# Patient Record
Sex: Female | Born: 1965 | Race: Black or African American | Hispanic: No | Marital: Married | Smoking: Never smoker
Health system: Southern US, Community
[De-identification: ages and names within clinical notes are randomized; demographics above are authoritative.]

## PROBLEM LIST (undated history)

## (undated) DIAGNOSIS — R51 Headache: Secondary | ICD-10-CM

## (undated) DIAGNOSIS — K279 Peptic ulcer, site unspecified, unspecified as acute or chronic, without hemorrhage or perforation: Secondary | ICD-10-CM

## (undated) DIAGNOSIS — Z9889 Other specified postprocedural states: Secondary | ICD-10-CM

## (undated) DIAGNOSIS — Z973 Presence of spectacles and contact lenses: Secondary | ICD-10-CM

## (undated) DIAGNOSIS — T7840XA Allergy, unspecified, initial encounter: Secondary | ICD-10-CM

## (undated) DIAGNOSIS — R112 Nausea with vomiting, unspecified: Secondary | ICD-10-CM

## (undated) DIAGNOSIS — E785 Hyperlipidemia, unspecified: Secondary | ICD-10-CM

## (undated) DIAGNOSIS — K635 Polyp of colon: Secondary | ICD-10-CM

## (undated) DIAGNOSIS — R519 Headache, unspecified: Secondary | ICD-10-CM

## (undated) DIAGNOSIS — J45909 Unspecified asthma, uncomplicated: Secondary | ICD-10-CM

## (undated) DIAGNOSIS — D259 Leiomyoma of uterus, unspecified: Secondary | ICD-10-CM

## (undated) DIAGNOSIS — J301 Allergic rhinitis due to pollen: Secondary | ICD-10-CM

## (undated) DIAGNOSIS — G43909 Migraine, unspecified, not intractable, without status migrainosus: Secondary | ICD-10-CM

## (undated) DIAGNOSIS — R7303 Prediabetes: Secondary | ICD-10-CM

## (undated) HISTORY — DX: Polyp of colon: K63.5

## (undated) HISTORY — PX: TUBAL LIGATION: SHX77

## (undated) HISTORY — DX: Migraine, unspecified, not intractable, without status migrainosus: G43.909

## (undated) HISTORY — DX: Hyperlipidemia, unspecified: E78.5

## (undated) HISTORY — DX: Unspecified asthma, uncomplicated: J45.909

## (undated) HISTORY — DX: Peptic ulcer, site unspecified, unspecified as acute or chronic, without hemorrhage or perforation: K27.9

## (undated) HISTORY — DX: Allergy, unspecified, initial encounter: T78.40XA

## (undated) HISTORY — PX: ABDOMINAL HYSTERECTOMY: SHX81

## (undated) HISTORY — DX: Headache, unspecified: R51.9

## (undated) HISTORY — DX: Headache: R51

## (undated) HISTORY — DX: Allergic rhinitis due to pollen: J30.1

---

## 1991-12-24 HISTORY — PX: TUBAL LIGATION: SHX77

## 2006-12-23 HISTORY — PX: NOVASURE ABLATION: SHX5394

## 2008-12-23 HISTORY — PX: OTHER SURGICAL HISTORY: SHX169

## 2008-12-23 HISTORY — PX: THYROIDECTOMY, PARTIAL: SHX18

## 2013-03-10 DIAGNOSIS — Z780 Asymptomatic menopausal state: Secondary | ICD-10-CM | POA: Insufficient documentation

## 2019-02-03 ENCOUNTER — Ambulatory Visit: Payer: 59 | Admitting: Family Medicine

## 2019-02-03 ENCOUNTER — Encounter: Payer: Self-pay | Admitting: Family Medicine

## 2019-02-03 VITALS — BP 110/80 | HR 71 | Temp 98.2°F | Ht 62.0 in | Wt 170.0 lb

## 2019-02-03 DIAGNOSIS — Z8669 Personal history of other diseases of the nervous system and sense organs: Secondary | ICD-10-CM | POA: Diagnosis not present

## 2019-02-03 DIAGNOSIS — Z8709 Personal history of other diseases of the respiratory system: Secondary | ICD-10-CM

## 2019-02-03 DIAGNOSIS — Z7689 Persons encountering health services in other specified circumstances: Secondary | ICD-10-CM

## 2019-02-03 DIAGNOSIS — J302 Other seasonal allergic rhinitis: Secondary | ICD-10-CM

## 2019-02-03 DIAGNOSIS — E785 Hyperlipidemia, unspecified: Secondary | ICD-10-CM | POA: Diagnosis not present

## 2019-02-03 NOTE — Patient Instructions (Signed)

## 2019-02-03 NOTE — Progress Notes (Signed)
Patient presents to clinic today to establish care.  SUBJECTIVE: PMH:Pt is a 53 yo female with pmh sig for HLD, allergies, migraines, asthma, h/o ulcers.  HLD: -noted yrs ago -not on meds -changed diet.  Pescatarian. -normal ever since  H/o asthma: -exercise induced -asymptomatic x yrs -not requiring inhaler  H/o migraines: -may have 1-2 per year -will happen if does not get enough sleep or does not eat -not requiring meds at this time.  Seasonal allergies: -no symptoms since moving to area -not taking any meds  Allergies: NKDA  Past surgical history: Tubal ligation 1993 Partial thyroidectomy 2010 for a fatty cyst Lipoma removal left shoulder 2000 [facility] 2008  Social history: Patient is married.  Moved from [place] to be with her husband.  She also moved her parents to the area.  Pt has a master's degree and currently works as a Tourist information centre manager.  Pt to start a new job at [employer] in March.  Pt has 2 children.  Pt endorses social alcohol use.  Pt denies tobacco and drug use.  Family medical history: Mom-alive, asthma, lymphoma hearing loss, HLD, HTN, kidney disease Dad-alive, asthma, dementia, neuropathy, tremors, prostate cancer, hearing loss, HLD, HTN, stroke Brother-Laura Rice, deceased, alcohol abuse, diabetes, early death Daughter-alive, alcohol abuse, drug abuse Son-alive asthma MGM-deceased, arthritis, diabetes MGF-deceased, arthritis PGM-deceased, asthma, early death PGF-deceased, alcohol abuse  Health Maintenance: Dental -- Vision -- Immunizations -- Colonoscopy -- Mammogram -- PAP --  Bone Density --    Past Medical History:  Diagnosis Date  . Colon polyps   . Frequent headaches   . Hay fever   . Hyperlipidemia   . Migraines   . Peptic ulcer     Past Surgical History:  Procedure Laterality Date  . [place]  2008  . partial thyrodectomy  2010  . TUBAL LIGATION      No current outpatient medications on file prior to visit.    No current facility-administered medications on file prior to visit.     No Known Allergies  History reviewed. No pertinent family history.  Social History   Socioeconomic History  . Marital status: Married    Spouse name: Not on file  . Number of children: Not on file  . Years of education: Not on file  . Highest education level: Not on file  Occupational History  . Not on file  Social Needs  . Financial resource strain: Not on file  . Food insecurity:    Worry: Not on file    Inability: Not on file  . Transportation needs:    Medical: Not on file    Non-medical: Not on file  Tobacco Use  . Smoking status: Never Smoker  . Smokeless tobacco: Never Used  Substance and Sexual Activity  . Alcohol use: Yes  . Drug use: Never  . Sexual activity: Yes  Lifestyle  . Physical activity:    Days per week: Not on file    Minutes per session: Not on file  . Stress: Not on file  Relationships  . Social connections:    Talks on phone: Not on file    Gets together: Not on file    Attends religious service: Not on file    Active member of club or organization: Not on file    Attends meetings of clubs or organizations: Not on file    Relationship status: Not on file  . Intimate partner violence:    Fear of current or ex partner: Not  on file    Emotionally abused: Not on file    Physically abused: Not on file    Forced sexual activity: Not on file  Other Topics Concern  . Not on file  Social History Narrative  . Not on file    ROS General: Denies fever, chills, night sweats, changes in weight, changes in appetite HEENT: Denies headaches, ear pain, changes in vision, rhinorrhea, sore throat CV: Denies CP, palpitations, SOB, orthopnea Pulm: Denies SOB, cough, wheezing GI: Denies abdominal pain, nausea, vomiting, diarrhea, constipation GU: Denies dysuria, hematuria, frequency, vaginal discharge Msk: Denies muscle cramps, joint pains Neuro: Denies weakness, numbness,  tingling Skin: Denies rashes, bruising Psych: Denies depression, anxiety, hallucinations  BP 110/80 (BP Location: Right Leg, Patient Position: Sitting, Cuff Size: Normal)   Pulse 71   Temp 98.2 F (36.8 C) (Oral)   Ht 5\' 2"  (1.575 m)   Wt 170 lb (77.1 kg)   SpO2 98%   BMI 31.09 kg/m   Physical Exam Gen. Pleasant, well developed, well-nourished, in NAD HEENT - [place]/AT, PERRL, no scleral icterus, no nasal drainage, pharynx without erythema or exudate. Lungs: no use of accessory muscles, CTAB, no wheezes, rales or rhonchi Cardiovascular: RRR, No r/g/m, no peripheral edema Abdomen: BS present, soft, nontender,nondistended  Neuro:  A&Ox3, CN II-XII intact, normal gait Skin:  Warm, dry, intact, no lesions  No results found for this or any previous visit (from the past 2160 hour(s)).  Assessment/Plan: Hyperlipidemia, unspecified hyperlipidemia type -discussed ways to reduce cholesterol including fish oil -continue lifestyle modificaitons -will check lipids at CPE when pt fasting  Seasonal allergies -stable -discussed avoiding triggers when possible -ok to use local honey, saline nasal rinse if needed  History of migraine -stable -discussed reducing migraine triggers. Pt encouraged to stay hydrated, get plenty of rest, eat regularly, and decrease stress  History of asthma -asymptomatic  Encounter to establish care -We reviewed the PMH, PSH, FH, SH, Meds and Allergies. -We provided refills for any medications we will prescribe as needed. -We addressed current concerns per orders and patient instructions. -We have asked for records for pertinent exams, studies, vaccines and notes from previous providers. -We have advised patient to follow up per instructions below.  Follow-up PRN in the next few weeks for CPE  Grier Mitts, MD

## 2019-03-01 ENCOUNTER — Encounter: Payer: Self-pay | Admitting: Family Medicine

## 2019-03-01 ENCOUNTER — Ambulatory Visit (INDEPENDENT_AMBULATORY_CARE_PROVIDER_SITE_OTHER): Payer: 59 | Admitting: Family Medicine

## 2019-03-01 VITALS — BP 120/70 | HR 80 | Temp 98.0°F | Wt 176.0 lb

## 2019-03-01 DIAGNOSIS — E785 Hyperlipidemia, unspecified: Secondary | ICD-10-CM | POA: Diagnosis not present

## 2019-03-01 DIAGNOSIS — G629 Polyneuropathy, unspecified: Secondary | ICD-10-CM

## 2019-03-01 DIAGNOSIS — M25541 Pain in joints of right hand: Secondary | ICD-10-CM

## 2019-03-01 DIAGNOSIS — Z0000 Encounter for general adult medical examination without abnormal findings: Secondary | ICD-10-CM

## 2019-03-01 DIAGNOSIS — M25542 Pain in joints of left hand: Secondary | ICD-10-CM

## 2019-03-01 LAB — CBC WITH DIFFERENTIAL/PLATELET
Basophils Absolute: 0.1 10*3/uL (ref 0.0–0.1)
Basophils Relative: 1.9 % (ref 0.0–3.0)
Eosinophils Absolute: 0.2 10*3/uL (ref 0.0–0.7)
Eosinophils Relative: 4.7 % (ref 0.0–5.0)
HEMATOCRIT: 42 % (ref 36.0–46.0)
Hemoglobin: 13.8 g/dL (ref 12.0–15.0)
Lymphocytes Relative: 46.8 % — ABNORMAL HIGH (ref 12.0–46.0)
Lymphs Abs: 1.6 10*3/uL (ref 0.7–4.0)
MCHC: 32.8 g/dL (ref 30.0–36.0)
MCV: 85.3 fl (ref 78.0–100.0)
Monocytes Absolute: 0.3 10*3/uL (ref 0.1–1.0)
Monocytes Relative: 7.6 % (ref 3.0–12.0)
NEUTROS ABS: 1.4 10*3/uL (ref 1.4–7.7)
Neutrophils Relative %: 39 % — ABNORMAL LOW (ref 43.0–77.0)
PLATELETS: 257 10*3/uL (ref 150.0–400.0)
RBC: 4.92 Mil/uL (ref 3.87–5.11)
RDW: 14 % (ref 11.5–15.5)
WBC: 3.5 10*3/uL — ABNORMAL LOW (ref 4.0–10.5)

## 2019-03-01 LAB — BASIC METABOLIC PANEL
BUN: 11 mg/dL (ref 6–23)
CO2: 30 mEq/L (ref 19–32)
Calcium: 9.6 mg/dL (ref 8.4–10.5)
Chloride: 102 mEq/L (ref 96–112)
Creatinine, Ser: 0.95 mg/dL (ref 0.40–1.20)
GFR: 74.51 mL/min (ref >60.00)
Glucose, Bld: 95 mg/dL (ref 70–99)
Potassium: 4.5 mEq/L (ref 3.5–5.1)
Sodium: 140 mEq/L (ref 135–145)

## 2019-03-01 LAB — VITAMIN B12: Vitamin B-12: 691 pg/mL (ref 211–911)

## 2019-03-01 LAB — LIPID PANEL
Cholesterol: 245 mg/dL — ABNORMAL HIGH (ref 0–200)
HDL: 51.6 mg/dL (ref >39.00)
NonHDL: 192.91
Total CHOL/HDL Ratio: 5
Triglycerides: 230 mg/dL — ABNORMAL HIGH (ref 0.0–149.0)
VLDL: 46 mg/dL — ABNORMAL HIGH (ref 0.0–40.0)

## 2019-03-01 LAB — C-REACTIVE PROTEIN: CRP: <1 mg/dL (ref 0.5–20.0)

## 2019-03-01 LAB — HEMOGLOBIN A1C: Hgb A1c MFr Bld: 5.7 % (ref 4.6–6.5)

## 2019-03-01 LAB — LDL CHOLESTEROL, DIRECT: Direct LDL: 151 mg/dL

## 2019-03-01 LAB — SEDIMENTATION RATE: Sed Rate: 12 mm/hr (ref 0–30)

## 2019-03-01 LAB — FOLATE

## 2019-03-01 LAB — TSH: TSH: 2.91 u[IU]/mL (ref 0.35–4.50)

## 2019-03-01 LAB — T4, FREE: Free T4: 0.72 ng/dL (ref 0.60–1.60)

## 2019-03-01 NOTE — Progress Notes (Signed)
Subjective:     Laura Rice is a 53 y.o. female and is here for a comprehensive physical exam. The patient reports problems - Joint pain, nerve pain.  Patient states overall she is doing well.  Patient endorses eating salty peanuts on Wednesday been developing pain from her back down her legs into her feet.  Patient has noticed this before when eating something salty.  Patient states pain did not resolve until Sunday.  Pain was noted as a throbbing, achy, "nerve pain" and not a muscle pain.  Patient denied muscle cramps/spasms.  Patient states the pain kept her awake at night.  Patient also endorses experiencing joint pain.  Patient notes pain in her fingers off and on as well as other joints.  Patient denies injury, edema.  Colonoscopy-2017 Pap-03/22/2018 Influenza vaccine 2019  Social History   Socioeconomic History  . Marital status: Married    Spouse name: Not on file  . Number of children: Not on file  . Years of education: Not on file  . Highest education level: Not on file  Occupational History  . Not on file  Social Needs  . Financial resource strain: Not on file  . Food insecurity:    Worry: Not on file    Inability: Not on file  . Transportation needs:    Medical: Not on file    Non-medical: Not on file  Tobacco Use  . Smoking status: Never Smoker  . Smokeless tobacco: Never Used  Substance and Sexual Activity  . Alcohol use: Yes  . Drug use: Never  . Sexual activity: Yes  Lifestyle  . Physical activity:    Days per week: Not on file    Minutes per session: Not on file  . Stress: Not on file  Relationships  . Social connections:    Talks on phone: Not on file    Gets together: Not on file    Attends religious service: Not on file    Active member of club or organization: Not on file    Attends meetings of clubs or organizations: Not on file    Relationship status: Not on file  . Intimate partner violence:    Fear of current or ex partner: Not on file     Emotionally abused: Not on file    Physically abused: Not on file    Forced sexual activity: Not on file  Other Topics Concern  . Not on file  Social History Narrative  . Not on file   Health Maintenance  Topic Date Due  . HIV Screening  05/07/1981  . TETANUS/TDAP  05/07/1985  . PAP SMEAR-Modifier  05/08/1987  . MAMMOGRAM  05/07/2016  . COLONOSCOPY  05/07/2016  . INFLUENZA VACCINE  07/23/2018    The following portions of the patient's history were reviewed and updated as appropriate: allergies, current medications, past family history, past medical history, past social history, past surgical history and problem list.  Review of Systems Pertinent items noted in HPI and remainder of comprehensive ROS otherwise negative.   Objective:    BP 120/70 (BP Location: Left Arm, Patient Position: Sitting, Cuff Size: Normal)   Pulse 80   Temp 98 F (36.7 C) (Oral)   Wt 176 lb (79.8 kg)   SpO2 98%   BMI 32.19 kg/m  General appearance: alert, cooperative and no distress Head: Normocephalic, without obvious abnormality, atraumatic Eyes: conjunctivae/corneas clear. PERRL, EOM's intact. Fundi benign. Ears: normal TM's and external ear canals both ears Nose: Nares normal.  Septum midline. Mucosa normal. No drainage or sinus tenderness. Throat: lips, mucosa, and tongue normal; teeth and gums normal Neck: no adenopathy, no carotid bruit, no JVD, supple, trachea midline and thyroid not enlarged, symmetric, no tenderness/mass/nodules Lungs: clear to auscultation bilaterally Heart: regular rate and rhythm, S1, S2 normal, no murmur, click, rub or gallop Abdomen: soft, non-tender; bowel sounds normal; no masses,  no organomegaly Extremities: extremities normal, atraumatic, no cyanosis or edema Pulses: 2+ and symmetric Skin: Skin color, texture, turgor normal. No rashes or lesions well-healed surgical scar at base of anterior neck Lymph nodes: Cervical, supraclavicular, and axillary nodes  normal. Neurologic: Alert and oriented X 3, normal strength and tone. Normal symmetric reflexes. Normal coordination and gait    Assessment:    Healthy female exam.      Plan:     Anticipatory guidance given including wearing seatbelts, smoke detectors in the home, increasing physical activity, increasing p.o. intake of water and vegetables. -We will obtain labs -Colonoscopy up-to-date, 2017 -Pap up-to-date 03/22/2018 -Patient to schedule mammogram -Given handout -Next CPE in 1 year See After Visit Summary for Counseling Recommendations   HLD -Diet controlled -We will obtain lipid panel  Neuropathy -We will obtain labs including B12, folate -Patient advised to continue limiting sodium intake  Arthralgias -Discussed limiting sodium intake -Given handout -We will obtain labs including Rh factor, ESR, CRP  Follow-up PRN in the next few months  Grier Mitts, MD

## 2019-03-01 NOTE — Patient Instructions (Signed)
Preventive Care 40-64 Years, Female Preventive care refers to lifestyle choices and visits with your health care provider that can promote health and wellness. What does preventive care include?   A yearly physical exam. This is also called an annual well check.  Dental exams once or twice a year.  Routine eye exams. Ask your health care provider how often you should have your eyes checked.  Personal lifestyle choices, including: ? Daily care of your teeth and gums. ? Regular physical activity. ? Eating a healthy diet. ? Avoiding tobacco and drug use. ? Limiting alcohol use. ? Practicing safe sex. ? Taking low-dose aspirin daily starting at age 50. ? Taking vitamin and mineral supplements as recommended by your health care provider. What happens during an annual well check? The services and screenings done by your health care provider during your annual well check will depend on your age, overall health, lifestyle risk factors, and family history of disease. Counseling Your health care provider may ask you questions about your:  Alcohol use.  Tobacco use.  Drug use.  Emotional well-being.  Home and relationship well-being.  Sexual activity.  Eating habits.  Work and work environment.  Method of birth control.  Menstrual cycle.  Pregnancy history. Screening You may have the following tests or measurements:  Height, weight, and BMI.  Blood pressure.  Lipid and cholesterol levels. These may be checked every 5 years, or more frequently if you are over 50 years old.  Skin check.  Lung cancer screening. You may have this screening every year starting at age 55 if you have a 30-pack-year history of smoking and currently smoke or have quit within the past 15 years.  Colorectal cancer screening. All adults should have this screening starting at age 50 and continuing until age 75. Your health care provider may recommend screening at age 45. You will have tests every  1-10 years, depending on your results and the type of screening test. People at increased risk should start screening at an earlier age. Screening tests may include: ? Guaiac-based fecal occult blood testing. ? Fecal immunochemical test (FIT). ? Stool DNA test. ? Virtual colonoscopy. ? Sigmoidoscopy. During this test, a flexible tube with a tiny camera (sigmoidoscope) is used to examine your rectum and lower colon. The sigmoidoscope is inserted through your anus into your rectum and lower colon. ? Colonoscopy. During this test, a long, thin, flexible tube with a tiny camera (colonoscope) is used to examine your entire colon and rectum.  Hepatitis C blood test.  Hepatitis B blood test.  Sexually transmitted disease (STD) testing.  Diabetes screening. This is done by checking your blood sugar (glucose) after you have not eaten for a while (fasting). You may have this done every 1-3 years.  Mammogram. This may be done every 1-2 years. Talk to your health care provider about when you should start having regular mammograms. This may depend on whether you have a family history of breast cancer.  BRCA-related cancer screening. This may be done if you have a family history of breast, ovarian, tubal, or peritoneal cancers.  Pelvic exam and Pap test. This may be done every 3 years starting at age 21. Starting at age 30, this may be done every 5 years if you have a Pap test in combination with an HPV test.  Bone density scan. This is done to screen for osteoporosis. You may have this scan if you are at high risk for osteoporosis. Discuss your test results, treatment options,   and if necessary, the need for more tests with your health care provider. Vaccines Your health care provider may recommend certain vaccines, such as:  Influenza vaccine. This is recommended every year.  Tetanus, diphtheria, and acellular pertussis (Tdap, Td) vaccine. You may need a Td booster every 10 years.  Varicella  vaccine. You may need this if you have not been vaccinated.  Zoster vaccine. You may need this after age 77.  Measles, mumps, and rubella (MMR) vaccine. You may need at least one dose of MMR if you were born in 1957 or later. You may also need a second dose.  Pneumococcal 13-valent conjugate (PCV13) vaccine. You may need this if you have certain conditions and were not previously vaccinated.  Pneumococcal polysaccharide (PPSV23) vaccine. You may need one or two doses if you smoke cigarettes or if you have certain conditions.  Meningococcal vaccine. You may need this if you have certain conditions.  Hepatitis A vaccine. You may need this if you have certain conditions or if you travel or work in places where you may be exposed to hepatitis A.  Hepatitis B vaccine. You may need this if you have certain conditions or if you travel or work in places where you may be exposed to hepatitis B.  Haemophilus influenzae type b (Hib) vaccine. You may need this if you have certain conditions. Talk to your health care provider about which screenings and vaccines you need and how often you need them. This information is not intended to replace advice given to you by your health care provider. Make sure you discuss any questions you have with your health care provider. Document Released: 01/05/2016 Document Revised: 01/29/2018 Document Reviewed: 10/10/2015 Elsevier Interactive Patient Education  2019 [place].  High Cholesterol  High cholesterol is a condition in which the blood has high levels of a white, waxy, fat-like substance (cholesterol). The human body needs small amounts of cholesterol. The liver makes all the cholesterol that the body needs. Extra (excess) cholesterol comes from the food that we eat. Cholesterol is carried from the liver by the blood through the blood vessels. If you have high cholesterol, deposits (plaques) may build up on the walls of your blood vessels (arteries). Plaques  make the arteries narrower and stiffer. Cholesterol plaques increase your risk for heart attack and stroke. Work with your health care provider to keep your cholesterol levels in a healthy range. What increases the risk? This condition is more likely to develop in people who:  Eat foods that are high in animal fat (saturated fat) or cholesterol.  Are overweight.  Are not getting enough exercise.  Have a family history of high cholesterol. What are the signs or symptoms? There are no symptoms of this condition. How is this diagnosed? This condition may be diagnosed from the results of a blood test.  If you are older than age 31, your health care provider may check your cholesterol every 4-6 years.  You may be checked more often if you already have high cholesterol or other risk factors for heart disease. The blood test for cholesterol measures:  "Bad" cholesterol (LDL cholesterol). This is the main type of cholesterol that causes heart disease. The desired level for LDL is less than 100.  "Good" cholesterol (HDL cholesterol). This type helps to protect against heart disease by cleaning the arteries and carrying the LDL away. The desired level for HDL is 60 or higher.  Triglycerides. These are fats that the body can store or burn for  energy. The desired number for triglycerides is lower than 150.  Total cholesterol. This is a measure of the total amount of cholesterol in your blood, including LDL cholesterol, HDL cholesterol, and triglycerides. A healthy number is less than 200. How is this treated? This condition is treated with diet changes, lifestyle changes, and medicines. Diet changes  This may include eating more whole grains, fruits, vegetables, nuts, and fish.  This may also include cutting back on red meat and foods that have a lot of added sugar. Lifestyle changes  Changes may include getting at least 40 minutes of aerobic exercise 3 times a week. Aerobic exercises  include walking, biking, and swimming. Aerobic exercise along with a healthy diet can help you maintain a healthy weight.  Changes may also include quitting smoking. Medicines  Medicines are usually given if diet and lifestyle changes have failed to reduce your cholesterol to healthy levels.  Your health care provider may prescribe a statin medicine. Statin medicines have been shown to reduce cholesterol, which can reduce the risk of heart disease. Follow these instructions at home: Eating and drinking If told by your health care provider:  Eat chicken (without skin), fish, veal, shellfish, ground [place] breast, and round or loin cuts of red meat.  Do not eat fried foods or fatty meats, such as hot dogs and salami.  Eat plenty of fruits, such as apples.  Eat plenty of vegetables, such as broccoli, potatoes, and carrots.  Eat beans, peas, and lentils.  Eat grains such as barley, rice, couscous, and bulgur wheat.  Eat pasta without cream sauces.  Use skim or nonfat milk, and eat low-fat or nonfat yogurt and cheeses.  Do not eat or drink whole milk, cream, ice cream, egg yolks, or hard cheeses.  Do not eat stick margarine or tub margarines that contain trans fats (also called partially hydrogenated oils).  Do not eat saturated tropical oils, such as coconut oil and palm oil.  Do not eat cakes, cookies, crackers, or other baked goods that contain trans fats.  General instructions  Exercise as directed by your health care provider. Increase your activity level with activities such as gardening, walking, and taking the stairs.  Take over-the-counter and prescription medicines only as told by your health care provider.  Do not use any products that contain nicotine or tobacco, such as cigarettes and e-cigarettes. If you need help quitting, ask your health care provider.  Keep all follow-up visits as told by your health care provider. This is important. Contact a health care  provider if:  You are struggling to maintain a healthy diet or weight.  You need help to start on an exercise program.  You need help to stop smoking. Get help right away if:  You have chest pain.  You have trouble breathing. This information is not intended to replace advice given to you by your health care provider. Make sure you discuss any questions you have with your health care provider. Document Released: 12/09/2005 Document Revised: 07/06/2016 Document Reviewed: 06/08/2016 Elsevier Interactive Patient Education  2019 [place].  Joint Pain Joint pain (arthralgia) may be caused by many things. Joint pain is likely to go away when you follow instructions from your health care provider for relieving pain at home. However, joint pain can also be caused by conditions that require more treatment. Common causes of joint pain include:  Bruising in the area of the joint.  Injury caused by repeating certain movements too many times (overuse injury).  Age-related joint wear and tear (osteoarthritis).  Buildup of uric acid crystals in the joint (gout).  Inflammation of the joint (rheumatic disease).  Various other forms of arthritis.  Infections of the joint (septic arthritis) or of the bone (osteomyelitis). Your health care provider may recommend that you take pain medicine or wear a supportive device like an elastic bandage, sling, or splint. If your joint pain continues, you may need lab or imaging tests to diagnose the cause of your joint pain. Follow these instructions at home: Managing pain, stiffness, and swelling   If directed, put ice on the painful area. Icing can help to relieve joint pain and swelling. ? Put ice in a plastic bag. ? Place a towel between your skin and the bag. ? Leave the ice on for 20 minutes, 2-3 times a day.  If directed, apply heat to the painful area as often as told by your health care provider. Heat can reduce the stiffness of your muscles  and joints. Use the heat source that your health care provider recommends, such as a moist heat pack or a heating pad. ? Place a towel between your skin and the heat source. ? Leave the heat on for 20-30 minutes. ? Remove the heat if your skin turns bright red. This is especially important if you are unable to feel pain, heat, or cold. You may have a greater risk of getting burned.  Move your fingers or toes below the painful joint often. You can avoid stiffness and lessen swelling by doing this.  If possible, raise (elevate) the painful joint above the level of your heart while you are sitting or lying down. To do this, try putting a few pillows under the painful joint. Activity  Rest the painful joint for as long as directed. Do not do anything that causes or worsens pain.  Begin exercising or stretching the affected area, as told by your health care provider. Ask your health care provider what types of exercise are safe for you. If you have an elastic bandage, sling, or splint:  Wear the supportive device as told by your health care provider. Remove it only as told by your health care provider.  Loosen the device if your fingers or toes below the joint tingle, become numb, or turn cold and blue.  Keep the device clean.  Ask your health care provider if you should remove the device before bathing. You may need to cover it with a watertight covering when you take a bath or a shower. General instructions  Take over-the-counter and prescription medicines only as told by your health care provider.  Do not use any products that contain nicotine or tobacco, such as cigarettes and e-cigarettes. If you need help quitting, ask your health care provider.  Keep all follow-up visits as told by your health care provider. This is important. Contact a health care provider if:  You have pain that gets worse and does not get better with medicine.  Your joint pain does not improve within 3  days.  You have increased bruising or swelling.  You have a fever.  You lose 10 lb (4.5 kg) or more without trying. Get help right away if:  You cannot move the joint.  Your fingers or toes tingle, become numb, or turn cold and blue.  You have a fever along with a joint that is red, warm, and swollen. Summary  Joint pain (arthralgia) may be caused by many things.  Your health care provider may  recommend that you take pain medicine or wear a supportive device like an elastic bandage, sling, or splint.  If your joint pain continues, you may need tests to diagnose the cause of your joint pain.  Take over-the-counter and prescription medicines only as told by your health care provider. This information is not intended to replace advice given to you by your health care provider. Make sure you discuss any questions you have with your health care provider. Document Released: 12/09/2005 Document Revised: 09/24/2017 Document Reviewed: 09/24/2017 Elsevier Interactive Patient Education  2019 [employer].

## 2019-03-02 LAB — RHEUMATOID FACTOR: Rheumatoid fact SerPl-aCnc: <14 IU/mL (ref <14)

## 2019-05-20 ENCOUNTER — Other Ambulatory Visit: Payer: Self-pay | Admitting: Family Medicine

## 2019-05-20 DIAGNOSIS — Z1231 Encounter for screening mammogram for malignant neoplasm of breast: Secondary | ICD-10-CM

## 2019-06-05 ENCOUNTER — Other Ambulatory Visit: Payer: Self-pay

## 2019-06-05 ENCOUNTER — Encounter (HOSPITAL_COMMUNITY): Payer: Self-pay

## 2019-06-05 ENCOUNTER — Emergency Department (HOSPITAL_COMMUNITY)
Admission: EM | Admit: 2019-06-05 | Discharge: 2019-06-05 | Disposition: A | Discharge status: Home / Self Care | Payer: 59 | Attending: Emergency Medicine | Admitting: Emergency Medicine

## 2019-06-05 DIAGNOSIS — R110 Nausea: Secondary | ICD-10-CM | POA: Diagnosis not present

## 2019-06-05 DIAGNOSIS — R55 Syncope and collapse: Secondary | ICD-10-CM

## 2019-06-05 DIAGNOSIS — J45909 Unspecified asthma, uncomplicated: Secondary | ICD-10-CM | POA: Insufficient documentation

## 2019-06-05 LAB — BASIC METABOLIC PANEL
Anion gap: 11 (ref 5–15)
BUN: 21 mg/dL — ABNORMAL HIGH (ref 6–20)
CO2: 22 mmol/L (ref 22–32)
Calcium: 8.9 mg/dL (ref 8.9–10.3)
Chloride: 105 mmol/L (ref 98–111)
Creatinine, Ser: 1.18 mg/dL — ABNORMAL HIGH (ref 0.44–1.00)
GFR calc Af Amer: >60 mL/min (ref >60)
GFR calc non Af Amer: 53 mL/min — ABNORMAL LOW (ref >60)
Glucose, Bld: 122 mg/dL — ABNORMAL HIGH (ref 70–99)
Potassium: 4.9 mmol/L (ref 3.5–5.1)
Sodium: 138 mmol/L (ref 135–145)

## 2019-06-05 LAB — CBC
HCT: 37.8 % (ref 36.0–46.0)
Hemoglobin: 12.1 g/dL (ref 12.0–15.0)
MCH: 27.6 pg (ref 26.0–34.0)
MCHC: 32 g/dL (ref 30.0–36.0)
MCV: 86.3 fL (ref 80.0–100.0)
Platelets: 198 10*3/uL (ref 150–400)
RBC: 4.38 MIL/uL (ref 3.87–5.11)
RDW: 14.1 % (ref 11.5–15.5)
WBC: 5.3 10*3/uL (ref 4.0–10.5)
nRBC: 0 % (ref 0.0–0.2)

## 2019-06-05 LAB — CBG MONITORING, ED: Glucose-Capillary: 99 mg/dL (ref 70–99)

## 2019-06-05 MED ORDER — SODIUM CHLORIDE 0.9% FLUSH: 3.0000 mL | Freq: Once | INTRAVENOUS | Status: DC

## 2019-06-05 MED ORDER — LACTATED RINGERS IV BOLUS
1000.0000 mL | Freq: Once | INTRAVENOUS | Status: AC
  Administered 2019-06-05: 1000 mL via INTRAVENOUS

## 2019-06-05 NOTE — ED Notes (Signed)
Bed: WA17 Expected date:  Expected time:  Means of arrival:  Comments: EMS-syncope 

## 2019-06-05 NOTE — ED Provider Notes (Signed)
[facility] DEPT Provider Note   CSN: [id number] Arrival date & time: 06/05/19  1600     History   Chief Complaint Chief Complaint  Patient presents with  . Loss of Consciousness    HPI Laura Rice is a 53 y.o. female.     HPI  24yF with syncope. Was out walking with her husband when she began to feel very tired. She tells me she sat down because of this although triage note says her husband assisted her to the ground. Regardless, she subsequently had a brief syncopal event. Currently she says she feels tired and mildly nauseated. Her body feels heavy. Denies any pain then or currently. Denies hx of recurrent syncope. Reports she donated blood at 9 am today. Says she ate before and after.   Past Medical History:  Diagnosis Date  . Colon polyps   . Frequent headaches   . Hay fever   . Hyperlipidemia   . Migraines   . Peptic ulcer     Patient Active Problem List   Diagnosis Date Noted  . Hyperlipidemia 02/03/2019  . Seasonal allergies 02/03/2019  . History of migraine 02/03/2019  . History of asthma 02/03/2019    Past Surgical History:  Procedure Laterality Date  . [place]  2008  . partial thyrodectomy  2010  . TUBAL LIGATION       OB History   No obstetric history on file.      Home Medications    Prior to Admission medications   Not on File    Family History Family History  Problem Relation Age of Onset  . Alcohol abuse Brother   . Diabetes Brother   . Early death Brother   . Alcohol abuse Daughter   . Drug abuse Daughter   . Asthma Son   . Arthritis Maternal Grandmother   . Diabetes Maternal Grandmother   . Arthritis Maternal Grandfather   . Asthma Paternal Grandmother   . Early death Paternal Grandmother     Social History Social History   Tobacco Use  . Smoking status: Never Smoker  . Smokeless tobacco: Never Used  Substance Use Topics  . Alcohol use: Yes    Comment: Occasional   .  Drug use: Never     Allergies   Patient has no known allergies.   Review of Systems Review of Systems  All systems reviewed and negative, other than as noted in HPI.  Physical Exam Updated Vital Signs BP 131/84 (BP Location: Right Arm)   Pulse 73   Temp 97.8 F (36.6 C) (Oral)   Resp 16   Ht 5\' 3"  (1.6 m)   Wt 73.6 kg   SpO2 99%   BMI 28.75 kg/m   Physical Exam Vitals signs and nursing note reviewed.  Constitutional:      General: She is not in acute distress.    Appearance: She is well-developed.  HENT:     Head: Normocephalic and atraumatic.  Eyes:     General:        Right eye: No discharge.        Left eye: No discharge.     Conjunctiva/sclera: Conjunctivae normal.  Neck:     Musculoskeletal: Neck supple.  Cardiovascular:     Rate and Rhythm: Normal rate and regular rhythm.     Heart sounds: Normal heart sounds. No murmur. No friction rub. No gallop.   Pulmonary:     Effort: Pulmonary effort is normal. No respiratory  distress.     Breath sounds: Normal breath sounds.  Abdominal:     General: There is no distension.     Palpations: Abdomen is soft.     Tenderness: There is no abdominal tenderness.  Musculoskeletal:        General: No tenderness.  Skin:    General: Skin is warm and dry.  Neurological:     General: No focal deficit present.     Mental Status: She is alert and oriented to person, place, and time.     Cranial Nerves: No cranial nerve deficit.     Sensory: No sensory deficit.     Motor: No weakness.     Coordination: Coordination normal.  Psychiatric:        Behavior: Behavior normal.        Thought Content: Thought content normal.      ED Treatments / Results  Labs (all labs ordered are listed, but only abnormal results are displayed) Labs Reviewed  BASIC METABOLIC PANEL - Abnormal; Notable for the following components:      Result Value   Glucose, Bld 122 (*)    BUN 21 (*)    Creatinine, Ser 1.18 (*)    GFR calc non Af Amer  53 (*)    All other components within normal limits  CBC  CBG MONITORING, ED    EKG:  Rhythm: normal sinus Rate: 82 PR: 135 ms QRS: 92 ms QTc: 440 ST segments: normal Comparison: none   Radiology No results found.  Procedures Procedures (including critical care time)  Medications Ordered in ED Medications  lactated ringers bolus 1,000 mL (has no administration in time range)     Initial Impression / Assessment and Plan / ED Course  I have reviewed the triage vital signs and the nursing notes.  Pertinent labs & imaging results that were available during my care of the patient were reviewed by me and considered in my medical decision making (see chart for details).  53yF with syncope. Happened while walking at what she considers a casual pace. Had a preceding prodrome. Although H/H normal, blood donation today likely contributing and no historical factors have me otherwise particularly concerned at this point. Given a liter of IVF.   Doing better.  Ambulated without any difficulty.  I doubt emergent etiology.  Advised to stay well-hydrated.  Return precautions discussed.  Final Clinical Impressions(s) / ED Diagnoses   Final diagnoses:  Syncope, unspecified syncope type    ED Discharge Orders    None       Virgel Manifold, MD 06/07/19 1652

## 2019-06-05 NOTE — ED Triage Notes (Signed)
Patient arrived via [facility] from home. Patient is AOx4 and ambulatory at baseline. Patient donated blood this morning, and then about 45 minutes ago began to workout with husband. Patient had syncopal episode, husband caught patient and laid patient to ground. Patient denies pain, SOB, Chest pain, head, neck or back pain. Husband is in lobby.

## 2019-06-05 NOTE — ED Notes (Signed)
Patient has ambulated to restroom without complication, assistance or concerns.

## 2019-07-06 ENCOUNTER — Ambulatory Visit
Admission: RE | Admit: 2019-07-06 | Discharge: 2019-07-06 | Disposition: A | Discharge status: Home / Self Care | Payer: 59 | Source: Ambulatory Visit | Attending: Family Medicine | Admitting: Family Medicine

## 2019-07-06 ENCOUNTER — Other Ambulatory Visit: Payer: Self-pay

## 2019-07-06 DIAGNOSIS — Z1231 Encounter for screening mammogram for malignant neoplasm of breast: Secondary | ICD-10-CM

## 2020-05-24 ENCOUNTER — Other Ambulatory Visit: Payer: Self-pay | Admitting: Family Medicine

## 2020-05-24 DIAGNOSIS — Z1231 Encounter for screening mammogram for malignant neoplasm of breast: Secondary | ICD-10-CM

## 2020-06-13 ENCOUNTER — Other Ambulatory Visit: Payer: Self-pay

## 2020-06-14 ENCOUNTER — Ambulatory Visit (INDEPENDENT_AMBULATORY_CARE_PROVIDER_SITE_OTHER): Payer: 59 | Admitting: Family Medicine

## 2020-06-14 ENCOUNTER — Encounter: Payer: Self-pay | Admitting: Family Medicine

## 2020-06-14 VITALS — BP 110/78 | HR 74 | Temp 97.8°F | Wt 176.0 lb

## 2020-06-14 DIAGNOSIS — Z23 Encounter for immunization: Secondary | ICD-10-CM

## 2020-06-14 DIAGNOSIS — H6123 Impacted cerumen, bilateral: Secondary | ICD-10-CM

## 2020-06-14 DIAGNOSIS — N951 Menopausal and female climacteric states: Secondary | ICD-10-CM

## 2020-06-14 DIAGNOSIS — E785 Hyperlipidemia, unspecified: Secondary | ICD-10-CM | POA: Diagnosis not present

## 2020-06-14 DIAGNOSIS — Z1159 Encounter for screening for other viral diseases: Secondary | ICD-10-CM

## 2020-06-14 DIAGNOSIS — Z0000 Encounter for general adult medical examination without abnormal findings: Secondary | ICD-10-CM | POA: Diagnosis not present

## 2020-06-14 LAB — CBC WITH DIFFERENTIAL/PLATELET
Basophils Absolute: 0 10*3/uL (ref 0.0–0.1)
Basophils Relative: 1.2 % (ref 0.0–3.0)
Eosinophils Absolute: 0.2 10*3/uL (ref 0.0–0.7)
Eosinophils Relative: 6 % — ABNORMAL HIGH (ref 0.0–5.0)
HCT: 42.8 % (ref 36.0–46.0)
Hemoglobin: 13.9 g/dL (ref 12.0–15.0)
Lymphocytes Relative: 53.5 % — ABNORMAL HIGH (ref 12.0–46.0)
Lymphs Abs: 1.8 10*3/uL (ref 0.7–4.0)
MCHC: 32.5 g/dL (ref 30.0–36.0)
MCV: 84.3 fl (ref 78.0–100.0)
Monocytes Absolute: 0.3 10*3/uL (ref 0.1–1.0)
Monocytes Relative: 8.8 % (ref 3.0–12.0)
Neutro Abs: 1 10*3/uL — ABNORMAL LOW (ref 1.4–7.7)
Neutrophils Relative %: 30.5 % — ABNORMAL LOW (ref 43.0–77.0)
Platelets: 212 10*3/uL (ref 150.0–400.0)
RBC: 5.08 Mil/uL (ref 3.87–5.11)
RDW: 15.6 % — ABNORMAL HIGH (ref 11.5–15.5)
WBC: 3.3 10*3/uL — ABNORMAL LOW (ref 4.0–10.5)

## 2020-06-14 LAB — BASIC METABOLIC PANEL
BUN: 15 mg/dL (ref 6–23)
CO2: 29 mEq/L (ref 19–32)
Calcium: 9.2 mg/dL (ref 8.4–10.5)
Chloride: 104 mEq/L (ref 96–112)
Creatinine, Ser: 0.96 mg/dL (ref 0.40–1.20)
GFR: 73.26 mL/min (ref >60.00)
Glucose, Bld: 101 mg/dL — ABNORMAL HIGH (ref 70–99)
Potassium: 3.8 mEq/L (ref 3.5–5.1)
Sodium: 138 mEq/L (ref 135–145)

## 2020-06-14 LAB — TSH: TSH: 2.89 u[IU]/mL (ref 0.35–4.50)

## 2020-06-14 LAB — T4, FREE: Free T4: 0.81 ng/dL (ref 0.60–1.60)

## 2020-06-14 LAB — LIPID PANEL
Cholesterol: 277 mg/dL — ABNORMAL HIGH (ref 0–200)
HDL: 45.3 mg/dL (ref >39.00)
NonHDL: 232.03
Total CHOL/HDL Ratio: 6
Triglycerides: 252 mg/dL — ABNORMAL HIGH (ref 0.0–149.0)
VLDL: 50.4 mg/dL — ABNORMAL HIGH (ref 0.0–40.0)

## 2020-06-14 LAB — HEMOGLOBIN A1C: Hgb A1c MFr Bld: 6.1 % (ref 4.6–6.5)

## 2020-06-14 LAB — LDL CHOLESTEROL, DIRECT: Direct LDL: 162 mg/dL

## 2020-06-14 MED ORDER — GABAPENTIN 100 MG PO CAPS: 100.0000 mg | ORAL_CAPSULE | Freq: Every day | ORAL | 1 refills | Status: DC

## 2020-06-14 NOTE — Addendum Note (Signed)
Addended by: Wyvonne Lenz on: 06/14/2020 09:14 AM   Modules accepted: Orders

## 2020-06-14 NOTE — Progress Notes (Signed)
Subjective:     Rodnisha Driver Stange is a 54 y.o. female and is here for a comprehensive physical exam. The patient reports problems - hot flashes.  Pt notes difficulty sleeping 2/2 hotflashes/ waking up drenched in sweat.  Pt notes sweating during the day and having to keep a fan with her.  Pt also endorses weight gain, though eating healthy.  Tried soy, black coash, herb tea, and OTC estroven without relief.  Pt notes mammogram up to date-done 07/06/19, scheduled for 07/06/20.  Last pap 03/22/18.  Colonoscopy 2017.   Pt is a Programme researcher, broadcasting/film/video.  She has 4 grandkids and is hoping one of them will go to [employer].  Social History   Socioeconomic History  . Marital status: Married    Spouse name: Not on file  . Number of children: Not on file  . Years of education: Not on file  . Highest education level: Not on file  Occupational History  . Not on file  Tobacco Use  . Smoking status: Never Smoker  . Smokeless tobacco: Never Used  Vaping Use  . Vaping Use: Never used  Substance and Sexual Activity  . Alcohol use: Yes    Comment: Occasional   . Drug use: Never  . Sexual activity: Yes  Other Topics Concern  . Not on file  Social History Narrative  . Not on file   Social Determinants of Health   Financial Resource Strain:   . Difficulty of Paying Living Expenses:   Food Insecurity:   . Worried About Charity fundraiser in the Last Year:   . Arboriculturist in the Last Year:   Transportation Needs:   . Film/video editor (Medical):   Marland Kitchen Lack of Transportation (Non-Medical):   Physical Activity:   . Days of Exercise per Week:   . Minutes of Exercise per Session:   Stress:   . Feeling of Stress :   Social Connections:   . Frequency of Communication with Friends and Family:   . Frequency of Social Gatherings with Friends and Family:   . Attends Religious Services:   . Active Member of Clubs or Organizations:   . Attends Archivist Meetings:   Marland Kitchen Marital Status:   Intimate  Partner Violence:   . Fear of Current or Ex-Partner:   . Emotionally Abused:   Marland Kitchen Physically Abused:   . Sexually Abused:    Health Maintenance  Topic Date Due  . Hepatitis C Screening  Never done  . COVID-19 Vaccine (1) Never done  . HIV Screening  Never done  . TETANUS/TDAP  Never done  . INFLUENZA VACCINE  07/23/2020  . PAP SMEAR-Modifier  03/22/2021  . MAMMOGRAM  07/05/2021  . COLONOSCOPY  12/23/2025    The following portions of the patient's history were reviewed and updated as appropriate: allergies, current medications, past family history, past medical history, past social history, past surgical history and problem list.  Review of Systems Pertinent items noted in HPI and remainder of comprehensive ROS otherwise negative.   Objective:    BP 110/78 (BP Location: Left Arm, Patient Position: Sitting, Cuff Size: Normal)   Pulse 74   Temp 97.8 F (36.6 C) (Temporal)   Wt 176 lb (79.8 kg)   SpO2 97%   BMI 31.18 kg/m  General appearance: alert, cooperative, appears stated age and no distress Head: Normocephalic, without obvious abnormality, atraumatic Eyes: conjunctivae/corneas clear. PERRL, EOM's intact. Fundi benign. Ears: B/l cerumen impaction.  Normal  TM's and external ear canals both ears after irrigation. Nose: Nares normal. Septum midline. Mucosa normal. No drainage or sinus tenderness. Throat: lips, mucosa, and tongue normal; teeth and gums normal Neck: no adenopathy, no carotid bruit, no JVD, supple, symmetrical, trachea midline and thyroid not enlarged, symmetric, no tenderness/mass/nodules Lungs: clear to auscultation bilaterally Heart: regular rate and rhythm, S1, S2 normal, no murmur, click, rub or gallop Abdomen: soft, non-tender; bowel sounds normal; no masses,  no organomegaly Extremities: extremities normal, atraumatic, no cyanosis or edema Pulses: 2+ and symmetric Skin: Skin color, texture, turgor normal. No rashes or lesions Lymph nodes: Cervical,  supraclavicular, and axillary nodes normal. Neurologic: Alert and oriented X 3, normal strength and tone. Normal symmetric reflexes. Normal coordination and gait    Assessment:    Healthy female exam with b/l cerumen impaction.     Plan:     Anticipatory guidance given including wearing seatbelts, smoke detectors in the home, increasing physical activity, increasing p.o. intake of water and vegetables. -will obtain labs -mammogram up to date -pap up to date -handout given -next CPE in 1 yr See After Visit Summary for Counseling Recommendations    Hyperlipidemia, unspecified hyperlipidemia type  - Plan: Lipid panel  Hot flashes due to menopause  -given handout -discussed medication options - Plan: TSH, T4, Free, Hemoglobin A1c, gabapentin (NEURONTIN) 100 MG capsule  Need for Tdap vaccination -tdap given this visit  Need for hepatitis C screening test  - Plan: Hep C Antibody  Bilateral impacted cerumen -Consent obtained.  Bilateral ears irrigated.  Patient tolerated procedure well.  F/u in 1 month prn for hot flashes  Grier Mitts, MD

## 2020-06-14 NOTE — Patient Instructions (Addendum)
Try taking the gabapentin 100 mg at night for hot flashes.  If needed you can take it up to 3 times per day.  This medication may make you feel sleepy.   Preventive Care 75-54 Years Old, Female Preventive care refers to visits with your health care provider and lifestyle choices that can promote health and wellness. This includes:  A yearly physical exam. This may also be called an annual well check.  Regular dental visits and eye exams.  Immunizations.  Screening for certain conditions.  Healthy lifestyle choices, such as eating a healthy diet, getting regular exercise, not using drugs or products that contain nicotine and tobacco, and limiting alcohol use. What can I expect for my preventive care visit? Physical exam Your health care provider will check your:  Height and weight. This may be used to calculate body mass index (BMI), which tells if you are at a healthy weight.  Heart rate and blood pressure.  Skin for abnormal spots. Counseling Your health care provider may ask you questions about your:  Alcohol, tobacco, and drug use.  Emotional well-being.  Home and relationship well-being.  Sexual activity.  Eating habits.  Work and work Statistician.  Method of birth control.  Menstrual cycle.  Pregnancy history. What immunizations do I need?  Influenza (flu) vaccine  This is recommended every year. Tetanus, diphtheria, and pertussis (Tdap) vaccine  You may need a Td booster every 10 years. Varicella (chickenpox) vaccine  You may need this if you have not been vaccinated. Zoster (shingles) vaccine  You may need this after age 64. Measles, mumps, and rubella (MMR) vaccine  You may need at least one dose of MMR if you were born in 1957 or later. You may also need a second dose. Pneumococcal conjugate (PCV13) vaccine  You may need this if you have certain conditions and were not previously vaccinated. Pneumococcal polysaccharide (PPSV23) vaccine  You  may need one or two doses if you smoke cigarettes or if you have certain conditions. Meningococcal conjugate (MenACWY) vaccine  You may need this if you have certain conditions. Hepatitis A vaccine  You may need this if you have certain conditions or if you travel or work in places where you may be exposed to hepatitis A. Hepatitis B vaccine  You may need this if you have certain conditions or if you travel or work in places where you may be exposed to hepatitis B. Haemophilus influenzae type b (Hib) vaccine  You may need this if you have certain conditions. Human papillomavirus (HPV) vaccine  If recommended by your health care provider, you may need three doses over 6 months. You may receive vaccines as individual doses or as more than one vaccine together in one shot (combination vaccines). Talk with your health care provider about the risks and benefits of combination vaccines. What tests do I need? Blood tests  Lipid and cholesterol levels. These may be checked every 5 years, or more frequently if you are over 63 years old.  Hepatitis C test.  Hepatitis B test. Screening  Lung cancer screening. You may have this screening every year starting at age 91 if you have a 30-pack-year history of smoking and currently smoke or have quit within the past 15 years.  Colorectal cancer screening. All adults should have this screening starting at age 76 and continuing until age 26. Your health care provider may recommend screening at age 31 if you are at increased risk. You will have tests every 1-10 years, depending  on your results and the type of screening test.  Diabetes screening. This is done by checking your blood sugar (glucose) after you have not eaten for a while (fasting). You may have this done every 1-3 years.  Mammogram. This may be done every 1-2 years. Talk with your health care provider about when you should start having regular mammograms. This may depend on whether you have a  family history of breast cancer.  BRCA-related cancer screening. This may be done if you have a family history of breast, ovarian, tubal, or peritoneal cancers.  Pelvic exam and Pap test. This may be done every 3 years starting at age 64. Starting at age 67, this may be done every 5 years if you have a Pap test in combination with an HPV test. Other tests  Sexually transmitted disease (STD) testing.  Bone density scan. This is done to screen for osteoporosis. You may have this scan if you are at high risk for osteoporosis. Follow these instructions at home: Eating and drinking  Eat a diet that includes fresh fruits and vegetables, whole grains, lean protein, and low-fat dairy.  Take vitamin and mineral supplements as recommended by your health care provider.  Do not drink alcohol if: ? Your health care provider tells you not to drink. ? You are pregnant, may be pregnant, or are planning to become pregnant.  If you drink alcohol: ? Limit how much you have to 0-1 drink a day. ? Be aware of how much alcohol is in your drink. In the [place] one drink equals one 12 oz bottle of beer (355 mL), one 5 oz glass of wine (148 mL), or one 1 oz glass of hard liquor (44 mL). Lifestyle  Take daily care of your teeth and gums.  Stay active. Exercise for at least 30 minutes on 5 or more days each week.  Do not use any products that contain nicotine or tobacco, such as cigarettes, e-cigarettes, and chewing tobacco. If you need help quitting, ask your health care provider.  If you are sexually active, practice safe sex. Use a condom or other form of birth control (contraception) in order to prevent pregnancy and STIs (sexually transmitted infections).  If told by your health care provider, take low-dose aspirin daily starting at age 27. What's next?  Visit your health care provider once a year for a well check visit.  Ask your health care provider how often you should have your eyes and teeth  checked.  Stay up to date on all vaccines. This information is not intended to replace advice given to you by your health care provider. Make sure you discuss any questions you have with your health care provider. Document Revised: 08/20/2018 Document Reviewed: 08/20/2018 Elsevier Patient Education  2020 [place].  Menopause Menopause is the normal time of life when menstrual periods stop completely. It is usually confirmed by 12 months without a menstrual period. The transition to menopause (perimenopause) most often happens between the ages of 64 and 43. During perimenopause, hormone levels change in your body, which can cause symptoms and affect your health. Menopause may increase your risk for:  Loss of bone (osteoporosis), which causes bone breaks (fractures).  Depression.  Hardening and narrowing of the arteries (atherosclerosis), which can cause heart attacks and strokes. What are the causes? This condition is usually caused by a natural change in hormone levels that happens as you get older. The condition may also be caused by surgery to remove both ovaries (bilateral  oophorectomy). What increases the risk? This condition is more likely to start at an earlier age if you have certain medical conditions or treatments, including:  A tumor of the pituitary gland in the brain.  A disease that affects the ovaries and hormone production.  Radiation treatment for cancer.  Certain cancer treatments, such as chemotherapy or hormone (anti-estrogen) therapy.  Heavy smoking and excessive alcohol use.  Family history of early menopause. This condition is also more likely to develop earlier in women who are very thin. What are the signs or symptoms? Symptoms of this condition include:  Hot flashes.  Irregular menstrual periods.  Night sweats.  Changes in feelings about sex. This could be a decrease in sex drive or an increased comfort around your sexuality.  Vaginal dryness  and thinning of the vaginal walls. This may cause painful intercourse.  Dryness of the skin and development of wrinkles.  Headaches.  Problems sleeping (insomnia).  Mood swings or irritability.  Memory problems.  Weight gain.  Hair growth on the face and chest.  Bladder infections or problems with urinating. How is this diagnosed? This condition is diagnosed based on your medical history, a physical exam, your age, your menstrual history, and your symptoms. Hormone tests may also be done. How is this treated? In some cases, no treatment is needed. You and your health care provider should make a decision together about whether treatment is necessary. Treatment will be based on your individual condition and preferences. Treatment for this condition focuses on managing symptoms. Treatment may include:  Menopausal hormone therapy (MHT).  Medicines to treat specific symptoms or complications.  Acupuncture.  Vitamin or herbal supplements. Before starting treatment, make sure to let your health care provider know if you have a personal or family history of:  Heart disease.  Breast cancer.  Blood clots.  Diabetes.  Osteoporosis. Follow these instructions at home: Lifestyle  Do not use any products that contain nicotine or tobacco, such as cigarettes and e-cigarettes. If you need help quitting, ask your health care provider.  Get at least 30 minutes of physical activity on 5 or more days each week.  Avoid alcoholic and caffeinated beverages, as well as spicy foods. This may help prevent hot flashes.  Get 7-8 hours of sleep each night.  If you have hot flashes, try: ? Dressing in layers. ? Avoiding things that may trigger hot flashes, such as spicy food, warm places, or stress. ? Taking slow, deep breaths when a hot flash starts. ? Keeping a fan in your home and office.  Find ways to manage stress, such as deep breathing, meditation, or journaling.  Consider going to  group therapy with other women who are having menopause symptoms. Ask your health care provider about recommended group therapy meetings. Eating and drinking  Eat a healthy, balanced diet that contains whole grains, lean protein, low-fat dairy, and plenty of fruits and vegetables.  Your health care provider may recommend adding more soy to your diet. Foods that contain soy include tofu, tempeh, and soy milk.  Eat plenty of foods that contain calcium and vitamin D for bone health. Items that are rich in calcium include low-fat milk, yogurt, beans, almonds, sardines, broccoli, and kale. Medicines  Take over-the-counter and prescription medicines only as told by your health care provider.  Talk with your health care provider before starting any herbal supplements. If prescribed, take vitamins and supplements as told by your health care provider. These may include: ? Calcium. Women age 2  and older should get 1,200 mg (milligrams) of calcium every day. ? Vitamin D. Women need 600-800 International Units of vitamin D each day. ? Vitamins B12 and B6. Aim for 50 micrograms of B12 and 1.5 mg of B6 each day. General instructions  Keep track of your menstrual periods, including: ? When they occur. ? How heavy they are and how long they last. ? How much time passes between periods.  Keep track of your symptoms, noting when they start, how often you have them, and how long they last.  Use vaginal lubricants or moisturizers to help with vaginal dryness and improve comfort during sex.  Keep all follow-up visits as told by your health care provider. This is important. This includes any group therapy or counseling. Contact a health care provider if:  You are still having menstrual periods after age 80.  You have pain during sex.  You have not had a period for 12 months and you develop vaginal bleeding. Get help right away if:  You have: ? Severe depression. ? Excessive vaginal  bleeding. ? Pain when you urinate. ? A fast or irregular heart beat (palpitations). ? Severe headaches. ? Abdomen (abdominal) pain or severe indigestion.  You fell and you think you have a broken bone.  You develop leg or chest pain.  You develop vision problems.  You feel a lump in your breast. Summary  Menopause is the normal time of life when menstrual periods stop completely. It is usually confirmed by 12 months without a menstrual period.  The transition to menopause (perimenopause) most often happens between the ages of 25 and 57.  Symptoms can be managed through medicines, lifestyle changes, and complementary therapies such as acupuncture.  Eat a balanced diet that is rich in nutrients to promote bone health and heart health and to manage symptoms during menopause. This information is not intended to replace advice given to you by your health care provider. Make sure you discuss any questions you have with your health care provider. Document Revised: 11/21/2017 Document Reviewed: 01/11/2017 Elsevier Patient Education  2020 [place], Adult The ears produce a substance called earwax that helps keep bacteria out of the ear and protects the skin in the ear canal. Occasionally, earwax can build up in the ear and cause discomfort or hearing loss. What increases the risk? This condition is more likely to develop in people who:  Are female.  Are elderly.  Naturally produce more earwax.  Clean their ears often with cotton swabs.  Use earplugs often.  Use in-ear headphones often.  Wear hearing aids.  Have narrow ear canals.  Have earwax that is overly thick or sticky.  Have eczema.  Are dehydrated.  Have excess hair in the ear canal. What are the signs or symptoms? Symptoms of this condition include:  Reduced or muffled hearing.  A feeling of fullness in the ear or feeling that the ear is plugged.  Fluid coming from the ear.  Ear  pain.  Ear itch.  Ringing in the ear.  Coughing.  An obvious piece of earwax that can be seen inside the ear canal. How is this diagnosed? This condition may be diagnosed based on:  Your symptoms.  Your medical history.  An ear exam. During the exam, your health care provider will look into your ear with an instrument called an otoscope. You may have tests, including a hearing test. How is this treated? This condition may be treated by:  Using ear  drops to soften the earwax.  Having the earwax removed by a health care provider. The health care provider may: ? Flush the ear with water. ? Use an instrument that has a loop on the end (curette). ? Use a suction device.  Surgery to remove the wax buildup. This may be done in severe cases. Follow these instructions at home:   Take over-the-counter and prescription medicines only as told by your health care provider.  Do not put any objects, including cotton swabs, into your ear. You can clean the opening of your ear canal with a washcloth or facial tissue.  Follow instructions from your health care provider about cleaning your ears. Do not over-clean your ears.  Drink enough fluid to keep your urine clear or pale yellow. This will help to thin the earwax.  Keep all follow-up visits as told by your health care provider. If earwax builds up in your ears often or if you use hearing aids, consider seeing your health care provider for routine, preventive ear cleanings. Ask your health care provider how often you should schedule your cleanings.  If you have hearing aids, clean them according to instructions from the manufacturer and your health care provider. Contact a health care provider if:  You have ear pain.  You develop a fever.  You have blood, pus, or other fluid coming from your ear.  You have hearing loss.  You have ringing in your ears that does not go away.  Your symptoms do not improve with treatment.  You  feel like the room is spinning (vertigo). Summary  Earwax can build up in the ear and cause discomfort or hearing loss.  The most common symptoms of this condition include reduced or muffled hearing and a feeling of fullness in the ear or feeling that the ear is plugged.  This condition may be diagnosed based on your symptoms, your medical history, and an ear exam.  This condition may be treated by using ear drops to soften the earwax or by having the earwax removed by a health care provider.  Do not put any objects, including cotton swabs, into your ear. You can clean the opening of your ear canal with a washcloth or facial tissue. This information is not intended to replace advice given to you by your health care provider. Make sure you discuss any questions you have with your health care provider. Document Revised: 11/21/2017 Document Reviewed: 02/19/2017 Elsevier Patient Education  [place]. https://www.cdc.gov/vaccines/hcp/vis/vis-statements/tdap.pdf">  Tdap (Tetanus, Diphtheria, Pertussis) Vaccine: What You Need to Know 1. Why get vaccinated? Tdap vaccine can prevent tetanus, diphtheria, and pertussis. Diphtheria and pertussis spread from person to person. Tetanus enters the body through cuts or wounds.  TETANUS (T) causes painful stiffening of the muscles. Tetanus can lead to serious health problems, including being unable to open the mouth, having trouble swallowing and breathing, or death.  DIPHTHERIA (D) can lead to difficulty breathing, heart failure, paralysis, or death.  PERTUSSIS (aP), also known as "whooping cough," can cause uncontrollable, violent coughing which makes it hard to breathe, eat, or drink. Pertussis can be extremely serious in babies and young children, causing pneumonia, convulsions, brain damage, or death. In teens and adults, it can cause weight loss, loss of bladder control, passing out, and rib fractures from severe coughing. 2. Tdap  vaccine Tdap is only for children 7 years and older, adolescents, and adults.  Adolescents should receive a single dose of Tdap, preferably at age 56 or 85 years.  Pregnant women should get a dose of Tdap during every pregnancy, to protect the newborn from pertussis. Infants are most at risk for severe, life-threatening complications from pertussis. Adults who have never received Tdap should get a dose of Tdap. Also, adults should receive a booster dose every 10 years, or earlier in the case of a severe and dirty wound or burn. Booster doses can be either Tdap or Td (a different vaccine that protects against tetanus and diphtheria but not pertussis). Tdap may be given at the same time as other vaccines. 3. Talk with your health care provider Tell your vaccine provider if the person getting the vaccine:  Has had an allergic reaction after a previous dose of any vaccine that protects against tetanus, diphtheria, or pertussis, or has any severe, life-threatening allergies.  Has had a coma, decreased level of consciousness, or prolonged seizures within 7 days after a previous dose of any pertussis vaccine (DTP, DTaP, or Tdap).  Has seizures or another nervous system problem.  Has ever had Guillain-Barr Syndrome (also called GBS).  Has had severe pain or swelling after a previous dose of any vaccine that protects against tetanus or diphtheria. In some cases, your health care provider may decide to postpone Tdap vaccination to a future visit.  People with minor illnesses, such as a cold, may be vaccinated. People who are moderately or severely ill should usually wait until they recover before getting Tdap vaccine.  Your health care provider can give you more information. 4. Risks of a vaccine reaction  Pain, redness, or swelling where the shot was given, mild fever, headache, feeling tired, and nausea, vomiting, diarrhea, or stomachache sometimes happen after Tdap vaccine. People sometimes faint  after medical procedures, including vaccination. Tell your provider if you feel dizzy or have vision changes or ringing in the ears.  As with any medicine, there is a very remote chance of a vaccine causing a severe allergic reaction, other serious injury, or death. 5. What if there is a serious problem? An allergic reaction could occur after the vaccinated person leaves the clinic. If you see signs of a severe allergic reaction (hives, swelling of the face and throat, difficulty breathing, a fast heartbeat, dizziness, or weakness), call 9-1-1 and get the person to the nearest hospital. For other signs that concern you, call your health care provider.  Adverse reactions should be reported to the Vaccine Adverse Event Reporting System (VAERS). Your health care provider will usually file this report, or you can do it yourself. Visit the VAERS website at www.vaers.SamedayNews.es or call 9106956076. VAERS is only for reporting reactions, and VAERS staff do not give medical advice. 6. The National Vaccine Injury Compensation Program The [employer] Vaccine Injury Compensation Program (VICP) is a federal program that was created to compensate people who may have been injured by certain vaccines. Visit the VICP website at GoldCloset.com.ee or call 406-872-2415 to learn about the program and about filing a claim. There is a time limit to file a claim for compensation. 7. How can I learn more?  Ask your health care provider.  Call your local or state health department.  Contact the Centers for Disease Control and Prevention (CDC): ? Call (704)026-6275 (1-800-CDC-INFO) or ? Visit CDC's website at http://hunter.com/ Vaccine Information Statement Tdap (Tetanus, Diphtheria, Pertussis) Vaccine (03/24/2019) This information is not intended to replace advice given to you by your health care provider. Make sure you discuss any questions you have with your health care provider. Document Revised:  04/02/2019  Document Reviewed: 04/05/2019 Elsevier Patient Education  [employer].

## 2020-06-15 LAB — HEPATITIS C ANTIBODY
Hepatitis C Ab: NONREACTIVE
SIGNAL TO CUT-OFF: 0.01 (ref <1.00)

## 2020-07-06 ENCOUNTER — Ambulatory Visit
Admission: RE | Admit: 2020-07-06 | Discharge: 2020-07-06 | Disposition: A | Discharge status: Home / Self Care | Payer: 59 | Source: Ambulatory Visit

## 2020-07-06 ENCOUNTER — Other Ambulatory Visit: Payer: Self-pay

## 2020-07-06 DIAGNOSIS — Z1231 Encounter for screening mammogram for malignant neoplasm of breast: Secondary | ICD-10-CM

## 2020-12-28 ENCOUNTER — Other Ambulatory Visit: Payer: Self-pay

## 2020-12-29 ENCOUNTER — Ambulatory Visit: Payer: 59 | Admitting: Family Medicine

## 2020-12-29 ENCOUNTER — Ambulatory Visit (INDEPENDENT_AMBULATORY_CARE_PROVIDER_SITE_OTHER): Payer: 59

## 2020-12-29 ENCOUNTER — Encounter: Payer: Self-pay | Admitting: Family Medicine

## 2020-12-29 VITALS — BP 120/82 | HR 92 | Temp 98.3°F | Ht 63.0 in | Wt 176.4 lb

## 2020-12-29 DIAGNOSIS — M25571 Pain in right ankle and joints of right foot: Secondary | ICD-10-CM

## 2020-12-29 DIAGNOSIS — G8929 Other chronic pain: Secondary | ICD-10-CM

## 2020-12-29 DIAGNOSIS — M25473 Effusion, unspecified ankle: Secondary | ICD-10-CM

## 2020-12-29 NOTE — Progress Notes (Signed)
Subjective:    Patient ID: Laura Rice, female    DOB: Apr 10, 1966, 55 y.o.   MRN: [id number]  No chief complaint on file.   HPI Patient is a pleasant 55 year old female with pmh sig for h/o migraines, HLD, seasonal allergies who was seen for ongoing concern.  Patient endorses right ankle pain and edema x1 month.  Patient states symptoms started after stepping out of bed one morning.  Pain at medial malleolus.  Pt denies injury.  Pt endorses standing or applying pressure to the foot causes pain.  Pt tried topical medications which have not helped.  Pt denies increase in physical activity.  Wearing flat shoes or riding boots to work.  Denies recent travel, LE edema, calf pain.  Patient is a Programme researcher, broadcasting/film/video.  She is keeping busy at work.  Past Medical History:  Diagnosis Date  . Colon polyps   . Frequent headaches   . Hay fever   . Hyperlipidemia   . Migraines   . Peptic ulcer     No Known Allergies  ROS General: Denies fever, chills, night sweats, changes in weight, changes in appetite HEENT: Denies headaches, ear pain, changes in vision, rhinorrhea, sore throat CV: Denies CP, palpitations, SOB, orthopnea Pulm: Denies SOB, cough, wheezing GI: Denies abdominal pain, nausea, vomiting, diarrhea, constipation GU: Denies dysuria, hematuria, frequency, vaginal discharge Msk: Denies muscle cramps, joint pains + right ankle edema/pain Neuro: Denies weakness, numbness, tingling Skin: Denies rashes, bruising Psych: Denies depression, anxiety, hallucinations     Objective:    Blood pressure 120/82, pulse 92, temperature 98.3 F (36.8 C), temperature source Oral, height 5\' 3"  (1.6 m), weight 176 lb 6.4 oz (80 kg), SpO2 97 %.  Gen. Pleasant, well-nourished, in no distress, normal affect   HEENT: [place]/AT, face symmetric, conjunctiva clear, no scleral icterus, PERRLA, EOMI, nares patent without drainage, pharynx without erythema or exudate. Neck: No JVD, no thyromegaly, no carotid  bruits Lungs: no accessory muscle use, CTAB, no wheezes or rales Cardiovascular: RRR, no m/r/g, no peripheral edema, DP and PT pulses 2+ on right LE Musculoskeletal: No TTP of right ankle or foot.  No pain with eversion or inversion of right foot.  Negative Homans' sign.  Normal strength with dorsiflexion and plantarflexion.  No deformities, no cyanosis or clubbing, normal tone Neuro:  A&Ox3, CN II-XII intact, normal gait   Wt Readings from Last 3 Encounters:  12/29/20 176 lb 6.4 oz (80 kg)  06/14/20 176 lb (79.8 kg)  06/05/19 162 lb 5.2 oz (73.6 kg)    Lab Results  Component Value Date   WBC 3.3 (L) 06/14/2020   HGB 13.9 06/14/2020   HCT 42.8 06/14/2020   PLT 212.0 06/14/2020   GLUCOSE 101 (H) 06/14/2020   CHOL 277 (H) 06/14/2020   TRIG 252.0 (H) 06/14/2020   HDL 45.30 06/14/2020   LDLDIRECT 162.0 06/14/2020   NA 138 06/14/2020   K 3.8 06/14/2020   CL 104 06/14/2020   CREATININE 0.96 06/14/2020   BUN 15 06/14/2020   CO2 29 06/14/2020   TSH 2.89 06/14/2020   HGBA1C 6.1 06/14/2020    Assessment/Plan:  Chronic pain of right ankle -Discussed possible causes including sprain, avulsion fracture, arthritis, gout -Given duration of symptoms will obtain imaging -Discussed supportive care including elevation, compression, rest, ice, heat, NSAIDs, and exercises -Ankle wrapped with Ace bandage by this provider -Further recommendations based on imaging -Given precautions - Plan: DG Ankle Complete Right  Ankle edema -Supportive care as discussed above  F/u as needed  Grier Mitts, MD

## 2020-12-29 NOTE — Patient Instructions (Signed)
Ankle Pain The ankle joint holds your body weight and allows you to move around. Ankle pain can occur on either side or the back of one ankle or both ankles. Ankle pain may be sharp and burning or dull and aching. There may be tenderness, stiffness, redness, or warmth around the ankle. Many things can cause ankle pain, including an injury to the area and overuse of the ankle. Follow these instructions at home: Activity  Rest your ankle as told by your health care provider. Avoid any activities that cause ankle pain.  Do not use the injured limb to support your body weight until your health care provider says that you can. Use crutches as told by your health care provider.  Do exercises as told by your health care provider.  Ask your health care provider when it is safe to drive if you have a brace on your ankle. If you have a brace:  Wear the brace as told by your health care provider. Remove it only as told by your health care provider.  Loosen the brace if your toes tingle, become numb, or turn cold and blue.  Keep the brace clean.  If the brace is not waterproof: ? Do not let it get wet. ? Cover it with a watertight covering when you take a bath or shower. If you were given an elastic bandage:   Remove it when you take a bath or a shower.  Try not to move your ankle very much, but wiggle your toes from time to time. This helps to prevent swelling.  Adjust the bandage to make it more comfortable if it feels too tight.  Loosen the bandage if you have numbness or tingling in your foot or if your foot turns cold and blue. Managing pain, stiffness, and swelling   If directed, put ice on the painful area. ? If you have a removable brace or elastic bandage, remove it as told by your health care provider. ? Put ice in a plastic bag. ? Place a towel between your skin and the bag. ? Leave the ice on for 20 minutes, 2-3 times a day.  Move your toes often to avoid stiffness and to  lessen swelling.  Raise (elevate) your ankle above the level of your heart while you are sitting or lying down. General instructions  Record information about your pain. Writing down the following may be helpful for you and your health care provider: ? How often you have ankle pain. ? Where the pain is located. ? What the pain feels like.  If treatment involves wearing a prescribed shoe or insole, make sure you wear it correctly and for as long as told by your health care provider.  Take over-the-counter and prescription medicines only as told by your health care provider.  Keep all follow-up visits as told by your health care provider. This is important. Contact a health care provider if:  Your pain gets worse.  Your pain is not relieved with medicines.  You have a fever or chills.  You are having more trouble with walking.  You have new symptoms. Get help right away if:  Your foot, leg, toes, or ankle: ? Tingles or becomes numb. ? Becomes swollen. ? Turns pale or blue. Summary  Ankle pain can occur on either side or the back of one ankle or both ankles.  Ankle pain may be sharp and burning or dull and aching.  Rest your ankle as told by your health care provider.   If told, apply ice to the area.  Take over-the-counter and prescription medicines only as told by your health care provider. This information is not intended to replace advice given to you by your health care provider. Make sure you discuss any questions you have with your health care provider. Document Revised: 03/30/2019 Document Reviewed: 06/17/2018 Elsevier Patient Education  2020 Elsevier Inc[employer]  

## 2021-05-31 ENCOUNTER — Other Ambulatory Visit: Payer: Self-pay | Admitting: Family Medicine

## 2021-05-31 DIAGNOSIS — Z1231 Encounter for screening mammogram for malignant neoplasm of breast: Secondary | ICD-10-CM

## 2021-07-09 ENCOUNTER — Other Ambulatory Visit: Payer: Self-pay

## 2021-07-09 ENCOUNTER — Ambulatory Visit: Payer: 59 | Admitting: Family Medicine

## 2021-07-09 ENCOUNTER — Encounter: Payer: Self-pay | Admitting: Family Medicine

## 2021-07-09 ENCOUNTER — Other Ambulatory Visit (HOSPITAL_COMMUNITY)
Admission: RE | Admit: 2021-07-09 | Discharge: 2021-07-09 | Disposition: A | Discharge status: Home / Self Care | Payer: 59 | Source: Ambulatory Visit | Attending: Family Medicine | Admitting: Family Medicine

## 2021-07-09 VITALS — BP 122/82 | HR 78 | Temp 98.2°F | Ht 63.0 in | Wt 174.2 lb

## 2021-07-09 DIAGNOSIS — D1779 Benign lipomatous neoplasm of other sites: Secondary | ICD-10-CM

## 2021-07-09 DIAGNOSIS — N950 Postmenopausal bleeding: Secondary | ICD-10-CM | POA: Insufficient documentation

## 2021-07-09 DIAGNOSIS — Z0000 Encounter for general adult medical examination without abnormal findings: Secondary | ICD-10-CM | POA: Diagnosis not present

## 2021-07-09 DIAGNOSIS — N951 Menopausal and female climacteric states: Secondary | ICD-10-CM | POA: Diagnosis not present

## 2021-07-09 DIAGNOSIS — H6121 Impacted cerumen, right ear: Secondary | ICD-10-CM | POA: Diagnosis not present

## 2021-07-09 DIAGNOSIS — Z124 Encounter for screening for malignant neoplasm of cervix: Secondary | ICD-10-CM

## 2021-07-09 DIAGNOSIS — R5383 Other fatigue: Secondary | ICD-10-CM

## 2021-07-09 DIAGNOSIS — Z13220 Encounter for screening for lipoid disorders: Secondary | ICD-10-CM | POA: Diagnosis not present

## 2021-07-09 DIAGNOSIS — R2231 Localized swelling, mass and lump, right upper limb: Secondary | ICD-10-CM

## 2021-07-09 DIAGNOSIS — N841 Polyp of cervix uteri: Secondary | ICD-10-CM | POA: Diagnosis not present

## 2021-07-09 LAB — CBC WITH DIFFERENTIAL/PLATELET
Basophils Absolute: 0 10*3/uL (ref 0.0–0.1)
Basophils Relative: 1.2 % (ref 0.0–3.0)
Eosinophils Absolute: 0.1 10*3/uL (ref 0.0–0.7)
Eosinophils Relative: 4.1 % (ref 0.0–5.0)
HCT: 43.4 % (ref 36.0–46.0)
Hemoglobin: 14.6 g/dL (ref 12.0–15.0)
Lymphocytes Relative: 48.7 % — ABNORMAL HIGH (ref 12.0–46.0)
Lymphs Abs: 1.7 10*3/uL (ref 0.7–4.0)
MCHC: 33.7 g/dL (ref 30.0–36.0)
MCV: 84.1 fl (ref 78.0–100.0)
Monocytes Absolute: 0.3 10*3/uL (ref 0.1–1.0)
Monocytes Relative: 8.5 % (ref 3.0–12.0)
Neutro Abs: 1.3 10*3/uL — ABNORMAL LOW (ref 1.4–7.7)
Neutrophils Relative %: 37.5 % — ABNORMAL LOW (ref 43.0–77.0)
Platelets: 211 10*3/uL (ref 150.0–400.0)
RBC: 5.16 Mil/uL — ABNORMAL HIGH (ref 3.87–5.11)
RDW: 13.6 % (ref 11.5–15.5)
WBC: 3.5 10*3/uL — ABNORMAL LOW (ref 4.0–10.5)

## 2021-07-09 LAB — BASIC METABOLIC PANEL
BUN: 16 mg/dL (ref 6–23)
CO2: 28 mEq/L (ref 19–32)
Calcium: 9.4 mg/dL (ref 8.4–10.5)
Chloride: 104 mEq/L (ref 96–112)
Creatinine, Ser: 1.04 mg/dL (ref 0.40–1.20)
GFR: 60.65 mL/min (ref >60.00)
Glucose, Bld: 97 mg/dL (ref 70–99)
Potassium: 4.3 mEq/L (ref 3.5–5.1)
Sodium: 140 mEq/L (ref 135–145)

## 2021-07-09 LAB — POCT URINALYSIS DIPSTICK
Bilirubin, UA: NEGATIVE
Blood, UA: NEGATIVE
Glucose, UA: NEGATIVE
Ketones, UA: NEGATIVE
Leukocytes, UA: NEGATIVE
Nitrite, UA: NEGATIVE
Protein, UA: POSITIVE — AB
Spec Grav, UA: >=1.03 — AB (ref 1.010–1.025)
Urobilinogen, UA: NEGATIVE E.U./dL — AB
pH, UA: 6 (ref 5.0–8.0)

## 2021-07-09 LAB — HEMOGLOBIN A1C: Hgb A1c MFr Bld: 6.1 % (ref 4.6–6.5)

## 2021-07-09 LAB — FOLLICLE STIMULATING HORMONE: FSH: 109.5 m[IU]/mL

## 2021-07-09 LAB — LIPID PANEL
Cholesterol: 265 mg/dL — ABNORMAL HIGH (ref 0–200)
HDL: 50.2 mg/dL (ref >39.00)
NonHDL: 214.62
Total CHOL/HDL Ratio: 5
Triglycerides: 208 mg/dL — ABNORMAL HIGH (ref 0.0–149.0)
VLDL: 41.6 mg/dL — ABNORMAL HIGH (ref 0.0–40.0)

## 2021-07-09 LAB — VITAMIN D 25 HYDROXY (VIT D DEFICIENCY, FRACTURES): VITD: 25.54 ng/mL — ABNORMAL LOW (ref 30.00–100.00)

## 2021-07-09 LAB — T4, FREE: Free T4: 0.78 ng/dL (ref 0.60–1.60)

## 2021-07-09 LAB — LDL CHOLESTEROL, DIRECT: Direct LDL: 162 mg/dL

## 2021-07-09 LAB — TESTOSTERONE: Testosterone: 53.01 ng/dL — ABNORMAL HIGH (ref 15.00–40.00)

## 2021-07-09 LAB — TSH: TSH: 2.82 u[IU]/mL (ref 0.35–5.50)

## 2021-07-09 LAB — LUTEINIZING HORMONE: LH: 40.58 m[IU]/mL

## 2021-07-09 NOTE — Progress Notes (Signed)
Subjective:     Laura Rice Height is a 55 y.o. female and is here for a comprehensive physical exam. Hot flashes becoming worse.  Pt tired OTC products.  Also tried gabapentin 100 mg qhs, but if caused her to feel like a "zombie".  Endorses night sweats since 2004.  LMP 2009.  Last pap 2019.  Notes intermittent spotting on tissue in am x 2 months.  Endorses h/o small fibroids "in the muscle" and having the " lining burned out".  Also endorses decreased energy, insomnia.    Mammogram scheduled for next month.  Social History   Socioeconomic History   Marital status: Married    Spouse name: Not on file   Number of children: Not on file   Years of education: Not on file   Highest education level: Not on file  Occupational History   Not on file  Tobacco Use   Smoking status: Never   Smokeless tobacco: Never  Vaping Use   Vaping Use: Never used  Substance and Sexual Activity   Alcohol use: Yes    Comment: Occasional    Drug use: Never   Sexual activity: Yes  Other Topics Concern   Not on file  Social History Narrative   Not on file   Social Determinants of Health   Financial Resource Strain: Not on file  Food Insecurity: Not on file  Transportation Needs: Not on file  Physical Activity: Not on file  Stress: Not on file  Social Connections: Not on file  Intimate Partner Violence: Not on file   Health Maintenance  Topic Date Due   HIV Screening  Never done   Zoster Vaccines- Shingrix (1 of 2) Never done   PAP SMEAR-Modifier  03/22/2021   INFLUENZA VACCINE  07/23/2021   COVID-19 Vaccine (4 - Booster for Pfizer series) 10/08/2021   MAMMOGRAM  07/06/2022   COLONOSCOPY (Pts 45-45yrs Insurance coverage will need to be confirmed)  12/23/2025   TETANUS/TDAP  06/14/2030   Hepatitis C Screening  Completed   Pneumococcal Vaccine 9-70 Years old  Aged Out   HPV VACCINES  Aged Out    The following portions of the patient's history were reviewed and updated as appropriate:  allergies, current medications, past family history, past medical history, past social history, past surgical history, and problem list.  Review of Systems Pertinent items noted in HPI and remainder of comprehensive ROS otherwise negative.   Objective:    BP 122/82 (BP Location: Right Arm, Patient Position: Sitting, Cuff Size: Normal)   Pulse 78   Temp 98.2 F (36.8 C) (Oral)   Ht 5\' 3"  (1.6 m)   Wt 174 lb 3.2 oz (79 kg)   SpO2 96%   BMI 30.86 kg/m  General appearance: alert, cooperative, and no distress Head: Normocephalic, without obvious abnormality, atraumatic Eyes: conjunctivae/corneas clear. PERRL, EOM's intact. Fundi benign. Ears: normal TM and external ear canal L ear.  Normal external right ear.  Right canal occluded with cerumen firm in appearance. Nose: Nares normal. Septum midline. Mucosa normal. No drainage or sinus tenderness. Throat: lips, mucosa, and tongue normal; teeth and gums normal Neck: no adenopathy, no carotid bruit, no JVD, supple, symmetrical, trachea midline, and partial thyroidectomy, R lobe normal.  Lipoma of L medial shoulder. Lungs: clear to auscultation bilaterally Breasts: normal appearance, several small round mobile non-tender cyst like areas in b/l breast.  no tenderness.  R axilla full in appearance with a solid mass firmer than than typical consistency of a lipoma  noted ~2.5 cm without tenderness. Heart: regular rate and rhythm, S1, S2 normal, no murmur, click, rub or gallop Abdomen: soft, non-tender; bowel sounds normal; no masses,  no organomegaly Pelvic: external genitalia normal, no adnexal masses or tenderness, no cervical motion tenderness, rectovaginal septum normal, uterus normal size, shape, and consistency, vagina normal without discharge, and cervix tilted to pt's R, a small 5 mm erythematous piece of tissue present in os with bleeding upon collection of pap using broom.   No CMT, no masses appreciated. Vaginal tissue mildly atrophic, light  pink  in color, voluminous vaginal rugae with scant white homogenous d/c noted.   Extremities: extremities normal, atraumatic, no cyanosis or edema Pulses: 2+ and symmetric Skin: Skin color, texture, turgor normal. No rashes or lesions  Lipoma of L medial shoulder.  R axilla with large edematous appearance with arm raised during breast exam.  A palpable mass noted, firm but not hard without fluctuance or TTP Lymph nodes: Cervical, supraclavicular, and axillary nodes normal. Neurologic: Alert and oriented X 3, normal strength and tone. Normal symmetric reflexes. Normal coordination and gait    Assessment:    Healthy female with recent post menopausal bleeding and increased menopause symptoms with cervical polyp noted on exam.     Plan:    Anticipatory guidance given including wearing seatbelts, smoke detectors in the home, increasing physical activity, increasing p.o. intake of water and vegetables. -pap done this visit -colonoscopy done -mammogram schedule next month -given handout See After Visit Summary for Counseling Recommendations   Hot flashes due to menopause  -LMP 2009 -will obtain labs -consider HRT - Plan: Testosterone, Follicle Stimulating Hormone, Luteinizing Hormone, Ambulatory referral to Obstetrics / Gynecology  Screening for cervical cancer  - Plan: PAP [[place]]  Post-menopausal bleeding  - Plan: PAP [[place]], POCT urinalysis dipstick, CBC with Differential/Platelet, TSH, T4, Free, Follicle Stimulating Hormone, Ambulatory referral to Obstetrics / Gynecology  Cervical polyp  -likely causing postmenopausal bleeding. - Plan: Ambulatory referral to Obstetrics / Gynecology  Screening for cholesterol level  - Plan: Lipid panel  Fatigue, unspecified type  - Plan: TSH, T4, Free, Vitamin D, 25-hydroxy  Axillary mass, right  -possibly 2/2 a lipoma, also consider extramammary tissue - Plan: [place] AXILLA RIGHT  Lipoma of other specified sites -Recurrent  lipoma of left medial shoulder -Continue to monitor. -If becomes symptomatic or dramatically increases in size consider removal  Impacted cerumen of right ear -Consent obtained.  Right ear irrigated.  Cerumen remained after irrigation.  Patient tolerated procedure well. -Discussed OTC Debrox eardrops  F/u in 1 month.  Grier Mitts, MD

## 2021-07-10 ENCOUNTER — Other Ambulatory Visit: Payer: Self-pay | Admitting: Family Medicine

## 2021-07-10 DIAGNOSIS — R2231 Localized swelling, mass and lump, right upper limb: Secondary | ICD-10-CM

## 2021-07-10 LAB — CYTOLOGY - PAP
Chlamydia: NEGATIVE
Comment: NEGATIVE
Comment: NEGATIVE
Comment: NEGATIVE
Comment: NORMAL
Diagnosis: NEGATIVE
High risk HPV: NEGATIVE
Neisseria Gonorrhea: NEGATIVE
Trichomonas: NEGATIVE

## 2021-07-12 ENCOUNTER — Other Ambulatory Visit: Payer: Self-pay | Admitting: Family Medicine

## 2021-07-12 DIAGNOSIS — E559 Vitamin D deficiency, unspecified: Secondary | ICD-10-CM

## 2021-07-12 MED ORDER — VITAMIN D (ERGOCALCIFEROL) 1.25 MG (50000 UNIT) PO CAPS
50000.0000 [IU] | ORAL_CAPSULE | ORAL | 0 refills | Status: DC
Start: 2021-07-12 — End: 2022-07-24

## 2021-08-13 ENCOUNTER — Other Ambulatory Visit: Payer: Self-pay

## 2021-08-13 ENCOUNTER — Ambulatory Visit
Admission: RE | Admit: 2021-08-13 | Discharge: 2021-08-13 | Disposition: A | Discharge status: Home / Self Care | Payer: 59 | Source: Ambulatory Visit | Attending: Family Medicine | Admitting: Family Medicine

## 2021-08-13 DIAGNOSIS — R2231 Localized swelling, mass and lump, right upper limb: Secondary | ICD-10-CM

## 2021-12-23 DIAGNOSIS — D259 Leiomyoma of uterus, unspecified: Secondary | ICD-10-CM

## 2021-12-23 HISTORY — DX: Leiomyoma of uterus, unspecified: D25.9

## 2022-02-05 IMAGING — MG DIGITAL DIAGNOSTIC BILAT W/ TOMO W/ CAD
6 of 10 series · 6 of 30 positions shown · non-contrast
Comparison: Previous exam(s).

CLINICAL DATA: The patient's physician felt a palpable lump in the
right axilla.

EXAM:
DIGITAL DIAGNOSTIC BILATERAL MAMMOGRAM WITH TOMOSYNTHESIS AND CAD;
US AXILLARY RIGHT
TECHNIQUE: Bilateral digital diagnostic mammography and breast tomosynthesis
was performed. The images were evaluated with computer-aided
detection.; Targeted ultrasound examination of the right axilla was
performed.

[L MLO synth-2D]
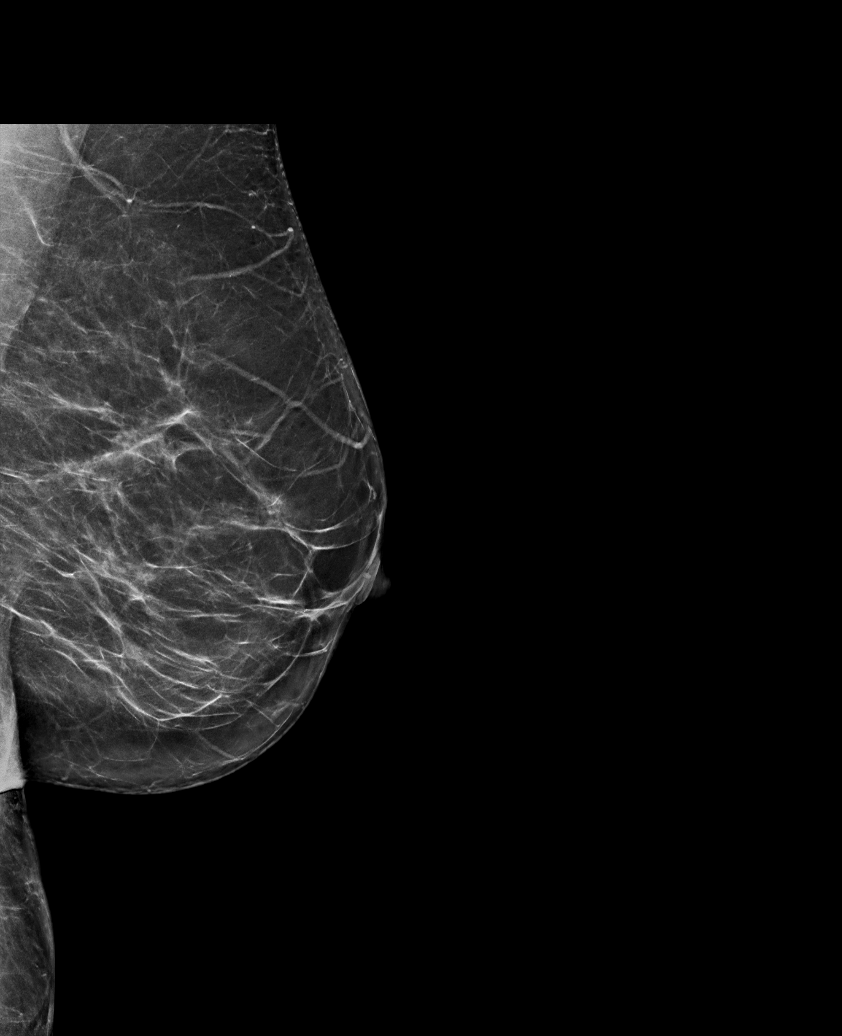

[R MLO synth-2D (1 of 2)]
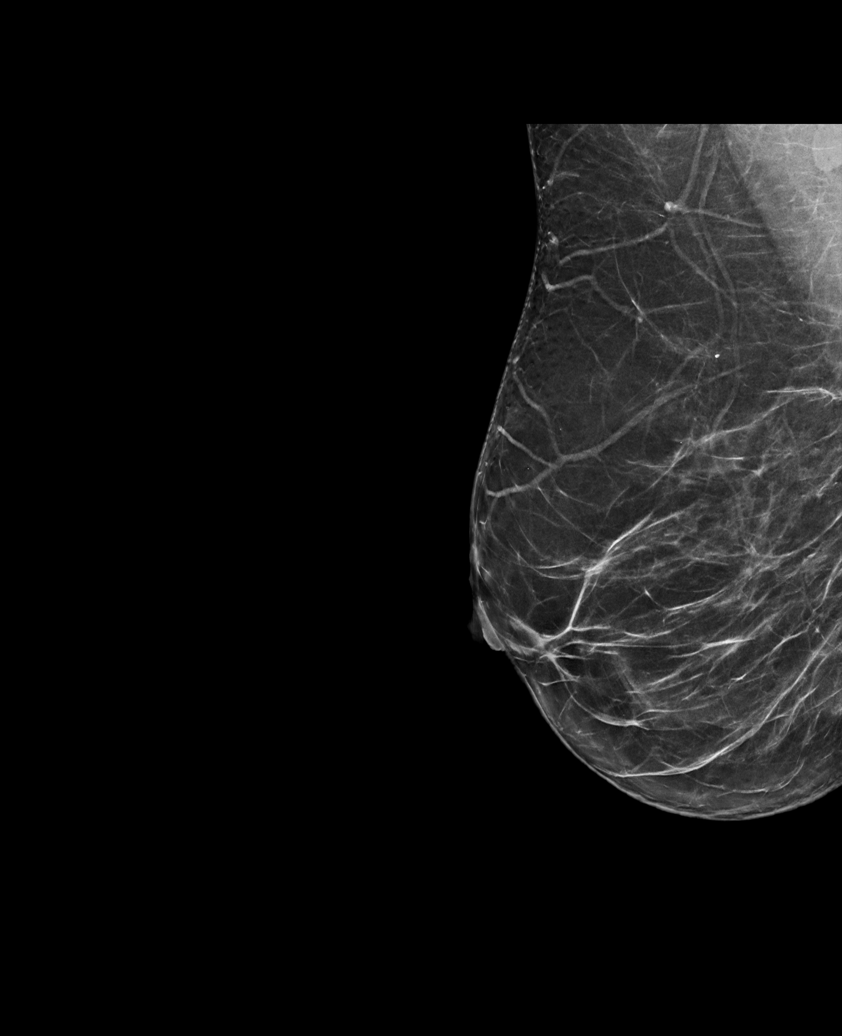

[R CC synth-2D]
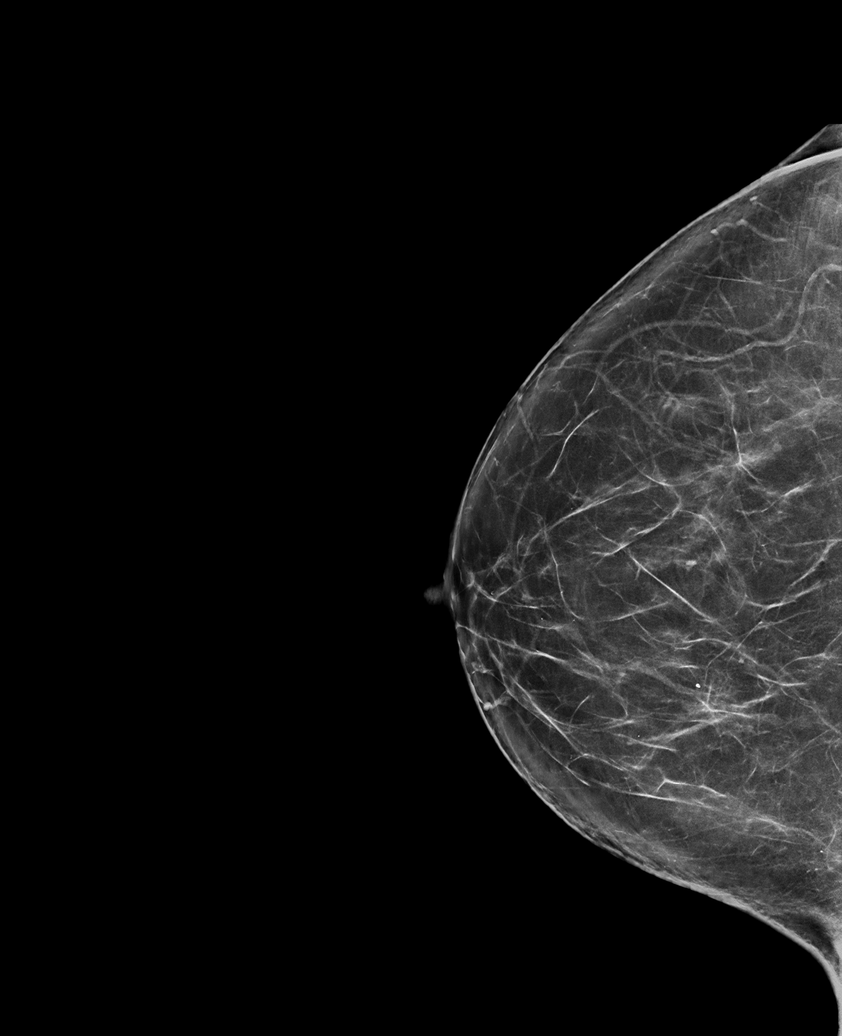

[R MLO synth-2D (2 of 2)]
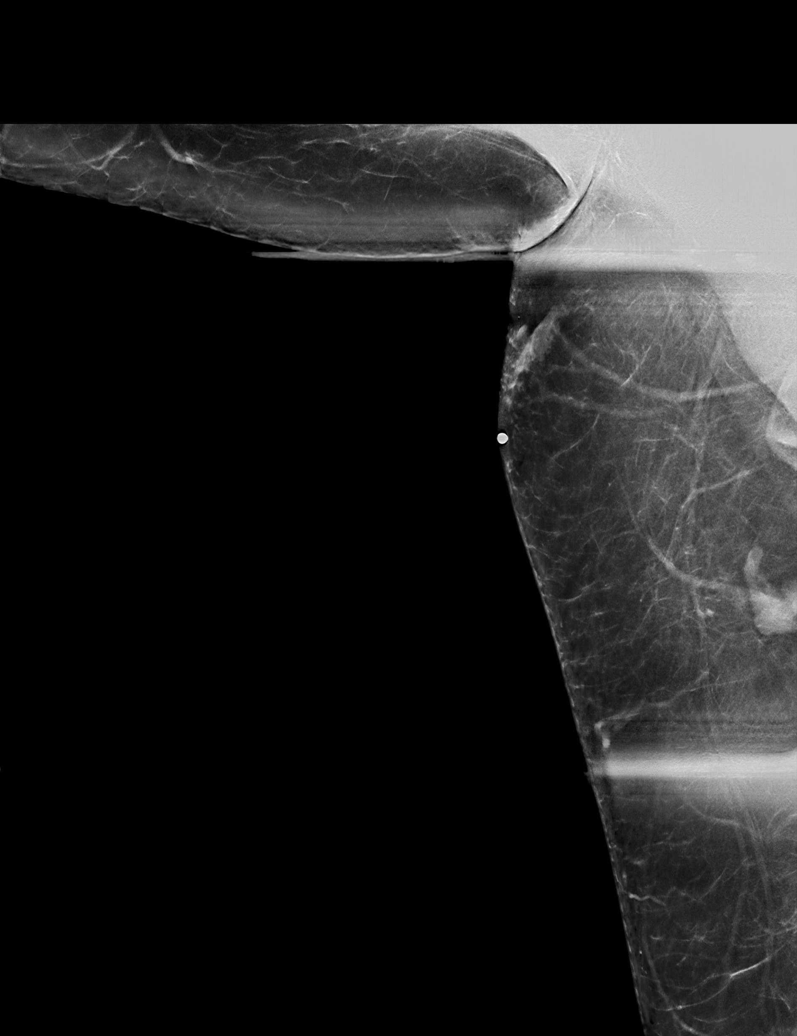

[L CC synth-2D]
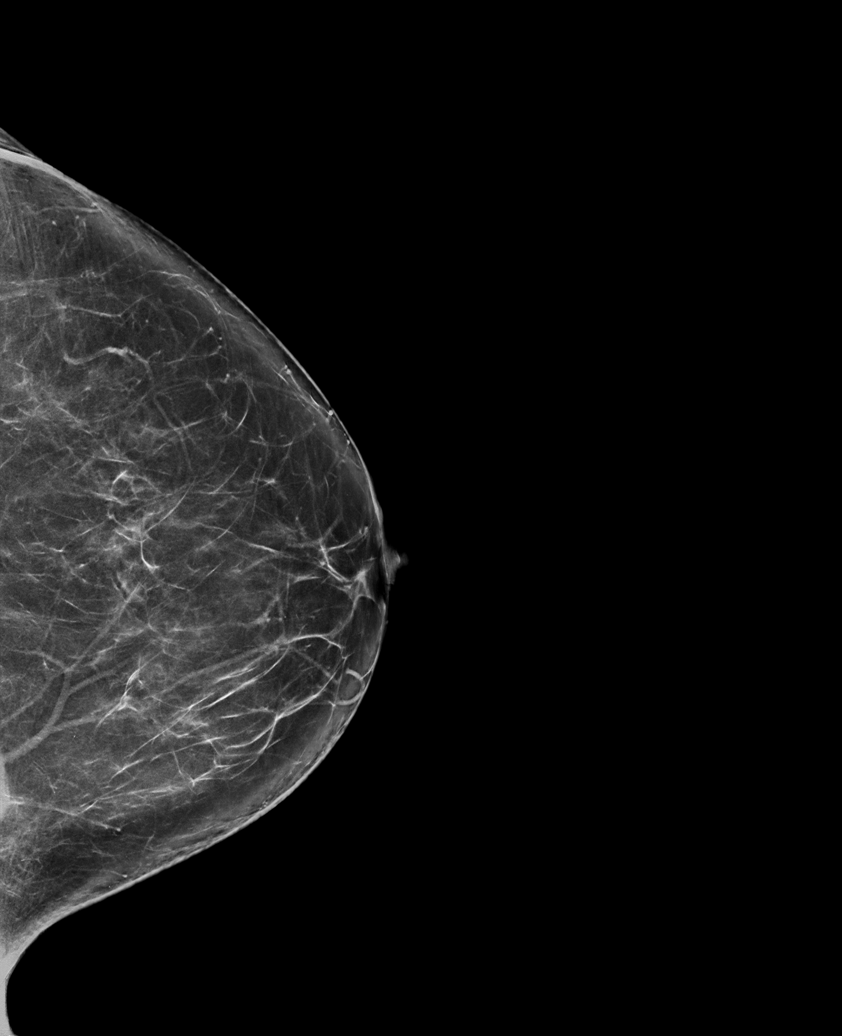

[L MLO tomo · tomo slice 37/72.0]
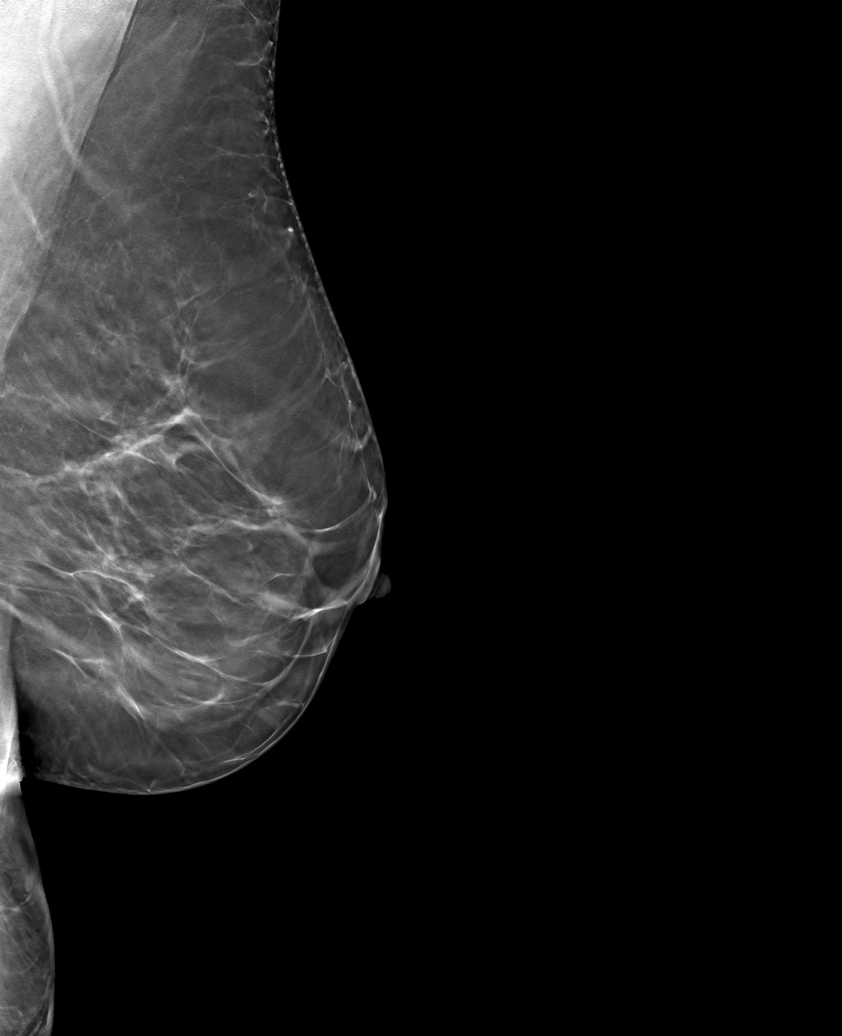

[6 of 30 positions shown; findings below may reference images not displayed]

ACR Breast Density Category b: There are scattered areas of
fibroglandular density.
FINDINGS: No suspicious masses, calcifications, or distortion identified in
either breast.

On physical exam, no suspicious lumps are identified.

Targeted ultrasound is performed, showing normal tissue in the right
axilla.
IMPRESSION: No mammographic or sonographic evidence of malignancy.

RECOMMENDATION:
Treatment of the patient's symptoms should be based on clinical and
physical exam given lack of imaging findings. Annual screening
mammography.

I have discussed the findings and recommendations with the patient.
If applicable, a reminder letter will be sent to the patient
regarding the next appointment.

BI-RADS CATEGORY  1: Negative.

## 2022-02-22 ENCOUNTER — Encounter: Payer: Self-pay | Admitting: Family Medicine

## 2022-02-22 DIAGNOSIS — D219 Benign neoplasm of connective and other soft tissue, unspecified: Secondary | ICD-10-CM | POA: Insufficient documentation

## 2022-03-28 ENCOUNTER — Other Ambulatory Visit: Payer: Self-pay | Admitting: Family Medicine

## 2022-03-28 DIAGNOSIS — E559 Vitamin D deficiency, unspecified: Secondary | ICD-10-CM

## 2022-05-27 ENCOUNTER — Other Ambulatory Visit (HOSPITAL_COMMUNITY): Payer: 59

## 2022-05-30 ENCOUNTER — Ambulatory Visit (HOSPITAL_BASED_OUTPATIENT_CLINIC_OR_DEPARTMENT_OTHER): Admit: 2022-05-30 | Payer: 59 | Admitting: Obstetrics and Gynecology

## 2022-05-30 ENCOUNTER — Encounter (HOSPITAL_BASED_OUTPATIENT_CLINIC_OR_DEPARTMENT_OTHER): Payer: Self-pay

## 2022-05-30 SURGERY — HYSTERECTOMY, VAGINAL, LAPAROSCOPY-ASSISTED, WITH SALPINGECTOMY
Anesthesia: General

## 2022-07-17 ENCOUNTER — Ambulatory Visit (INDEPENDENT_AMBULATORY_CARE_PROVIDER_SITE_OTHER): Payer: BC Managed Care – PPO | Admitting: Family Medicine

## 2022-07-17 VITALS — BP 128/88 | HR 81 | Temp 98.3°F | Ht 62.75 in | Wt 175.0 lb

## 2022-07-17 DIAGNOSIS — D219 Benign neoplasm of connective and other soft tissue, unspecified: Secondary | ICD-10-CM

## 2022-07-17 DIAGNOSIS — Z0000 Encounter for general adult medical examination without abnormal findings: Secondary | ICD-10-CM | POA: Diagnosis not present

## 2022-07-17 DIAGNOSIS — E782 Mixed hyperlipidemia: Secondary | ICD-10-CM

## 2022-07-17 DIAGNOSIS — Z8639 Personal history of other endocrine, nutritional and metabolic disease: Secondary | ICD-10-CM | POA: Diagnosis not present

## 2022-07-17 LAB — CBC WITH DIFFERENTIAL/PLATELET
Basophils Absolute: 0.1 10*3/uL (ref 0.0–0.1)
Basophils Relative: 1.3 % (ref 0.0–3.0)
Eosinophils Absolute: 0.2 10*3/uL (ref 0.0–0.7)
Eosinophils Relative: 4.3 % (ref 0.0–5.0)
HCT: 44.6 % (ref 36.0–46.0)
Hemoglobin: 14.5 g/dL (ref 12.0–15.0)
Lymphocytes Relative: 51.1 % — ABNORMAL HIGH (ref 12.0–46.0)
Lymphs Abs: 2 10*3/uL (ref 0.7–4.0)
MCHC: 32.6 g/dL (ref 30.0–36.0)
MCV: 86.4 fl (ref 78.0–100.0)
Monocytes Absolute: 0.4 10*3/uL (ref 0.1–1.0)
Monocytes Relative: 10.5 % (ref 3.0–12.0)
Neutro Abs: 1.3 10*3/uL — ABNORMAL LOW (ref 1.4–7.7)
Neutrophils Relative %: 32.8 % — ABNORMAL LOW (ref 43.0–77.0)
Platelets: 209 10*3/uL (ref 150.0–400.0)
RBC: 5.16 Mil/uL — ABNORMAL HIGH (ref 3.87–5.11)
RDW: 13.7 % (ref 11.5–15.5)
WBC: 3.9 10*3/uL — ABNORMAL LOW (ref 4.0–10.5)

## 2022-07-17 LAB — LIPID PANEL
Cholesterol: 301 mg/dL — ABNORMAL HIGH (ref 0–200)
HDL: 46.8 mg/dL (ref >39.00)
NonHDL: 253.84
Total CHOL/HDL Ratio: 6
Triglycerides: 371 mg/dL — ABNORMAL HIGH (ref 0.0–149.0)
VLDL: 74.2 mg/dL — ABNORMAL HIGH (ref 0.0–40.0)

## 2022-07-17 LAB — BASIC METABOLIC PANEL
BUN: 14 mg/dL (ref 6–23)
CO2: 31 mEq/L (ref 19–32)
Calcium: 9.7 mg/dL (ref 8.4–10.5)
Chloride: 102 mEq/L (ref 96–112)
Creatinine, Ser: 1 mg/dL (ref 0.40–1.20)
GFR: 63.12 mL/min (ref >60.00)
Glucose, Bld: 97 mg/dL (ref 70–99)
Potassium: 4.1 mEq/L (ref 3.5–5.1)
Sodium: 139 mEq/L (ref 135–145)

## 2022-07-17 LAB — VITAMIN D 25 HYDROXY (VIT D DEFICIENCY, FRACTURES): VITD: 25.13 ng/mL — ABNORMAL LOW (ref 30.00–100.00)

## 2022-07-17 LAB — T4, FREE: Free T4: 0.77 ng/dL (ref 0.60–1.60)

## 2022-07-17 LAB — TSH: TSH: 4.14 u[IU]/mL (ref 0.35–5.50)

## 2022-07-17 LAB — LDL CHOLESTEROL, DIRECT: Direct LDL: 159 mg/dL

## 2022-07-17 LAB — HEMOGLOBIN A1C: Hgb A1c MFr Bld: 6.1 % (ref 4.6–6.5)

## 2022-07-17 NOTE — Progress Notes (Signed)
Subjective:     Laura Rice is a 56 y.o. female and is here for a comprehensive physical exam. The patient reports doing well overall.  Pt's mother recently passed.  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  Patient has upcoming hysterectomy planned for fibroids.  Patient inquires about vitamin D level since completing 12-week course of ergocalciferol.  Last pap 07/10/2021 with OB/GYN.  Patient to schedule mammogram next month.   Social History   Socioeconomic History   Marital status: Married    Spouse name: Not on file   Number of children: Not on file   Years of education: Not on file   Highest education level: Master's degree (e.g., MA, MS, MEng, MEd, MSW, MBA)  Occupational History   Not on file  Tobacco Use   Smoking status: Never   Smokeless tobacco: Never  Vaping Use   Vaping Use: Never used  Substance and Sexual Activity   Alcohol use: Yes    Comment: Occasional    Drug use: Never   Sexual activity: Yes  Other Topics Concern   Not on file  Social History Narrative   Not on file   Social Determinants of Health   Financial Resource Strain: Low Risk  (07/17/2022)   Overall Financial Resource Strain (CARDIA)    Difficulty of Paying Living Expenses: Not hard at all  Food Insecurity: No Food Insecurity (07/17/2022)   Hunger Vital Sign    Worried About Running Out of Food in the Last Year: Never true    [place] in the Last Year: Never true  Transportation Needs: No Transportation Needs (07/17/2022)   PRAPARE - Hydrologist (Medical): No    Lack of Transportation (Non-Medical): No  Physical Activity: Insufficiently Active (07/17/2022)   Exercise Vital Sign    Days of Exercise per Week: 1 day    Minutes of Exercise per Session: 20 min  Stress: No Stress Concern Present (07/17/2022)   [place]    Feeling of Stress : Only a little  Social Connections:  Moderately Isolated (07/17/2022)   Social Connection and Isolation Panel [NHANES]    Frequency of Communication with Friends and Family: More than three times a week    Frequency of Social Gatherings with Friends and Family: Once a week    Attends Religious Services: Never    Marine scientist or Organizations: No    Attends Music therapist: Not on file    Marital Status: Married  Human resources officer Violence: Not on file   Health Maintenance  Topic Date Due   HIV Screening  Never done   COVID-19 Vaccine (5 - Pfizer series) 02/19/2022   INFLUENZA VACCINE  07/23/2022   MAMMOGRAM  08/14/2023   PAP SMEAR-Modifier  07/09/2024   COLONOSCOPY (Pts 45-59yr Insurance coverage will need to be confirmed)  12/23/2025   TETANUS/TDAP  06/14/2030   Hepatitis C Screening  Completed   Zoster Vaccines- Shingrix  Completed   HPV VACCINES  Aged Out    The following portions of the patient's history were reviewed and updated as appropriate: allergies, current medications, past family history, past medical history, past social history, past surgical history, and problem list.  Review of Systems Pertinent items noted in HPI and remainder of comprehensive ROS otherwise negative.   Objective:    BP 128/88 (BP Location: Right Arm, Patient Position: Sitting, Cuff Size: Large)   Pulse  81   Temp 98.3 F (36.8 C) (Oral)   Ht 5' 2.75" (1.594 m)   Wt 175 lb (79.4 kg)   SpO2 97%   BMI 31.25 kg/m  General appearance: alert, cooperative, and no distress Head: Normocephalic, without obvious abnormality, atraumatic Eyes: conjunctivae/corneas clear. PERRL, EOM's intact. Fundi benign. Ears: normal TM's and external ear canals both ears Nose: Nares normal. Septum midline. Mucosa normal. No drainage or sinus tenderness. Throat: lips, mucosa, and tongue normal; teeth and gums normal Neck: no adenopathy, no carotid bruit, no JVD, supple, symmetrical, trachea midline, and thyroid not enlarged,  symmetric, no tenderness/mass/nodules Lungs: clear to auscultation bilaterally Heart: regular rate and rhythm, S1, S2 normal, no murmur, click, rub or gallop Abdomen: soft, non-tender; bowel sounds normal; no masses,  no organomegaly Extremities: extremities normal, atraumatic, no cyanosis or edema Pulses: 2+ and symmetric Skin: Skin color, texture, turgor normal. No rashes or lesions Lymph nodes: Cervical, supraclavicular, and axillary nodes normal. Neurologic: Alert and oriented X 3, normal strength and tone. Normal symmetric reflexes. Normal coordination and gait    Assessment:    Healthy female exam.      Plan:    Anticipatory guidance given including wearing seatbelts, smoke detectors in the home, increasing physical activity, increasing p.o. intake of water and vegetables. -labs -Colonoscopy done May 23, 2016 in [place] done 08/13/2021.  Patient plans to schedule mammogram for this year next month. -Last pap 07/10/2021.  Followed by OB/GYN.  Hysterectomy scheduled 08/29/2022 for fibroids. -Next CPE in 1 year See After Visit Summary for Counseling Recommendations  - Plan: Basic metabolic panel, TSH, T4, Free, Hemoglobin A1c, Lipid panel  History of vitamin D deficiency -Vitamin D 25.54 on 07/09/2021  - Plan: Vitamin D, 25-hydroxy  Fibroids  -Hysterectomy plan 08/29/22 -Continue follow-up with OB/GYN - Plan: CBC with Differential/Platelet  Mixed hyperlipidemia -Cholesterol 265, HDL 50.2, triglycerides 208 on 07/09/2021 -Lifestyle modifications - Plan: Lipid panel  F/u prn  Grier Mitts, MD

## 2022-07-19 ENCOUNTER — Other Ambulatory Visit: Payer: Self-pay | Admitting: Family Medicine

## 2022-07-19 DIAGNOSIS — Z1231 Encounter for screening mammogram for malignant neoplasm of breast: Secondary | ICD-10-CM

## 2022-07-24 ENCOUNTER — Other Ambulatory Visit: Payer: Self-pay

## 2022-07-24 ENCOUNTER — Other Ambulatory Visit: Payer: Self-pay | Admitting: Family Medicine

## 2022-07-24 DIAGNOSIS — E785 Hyperlipidemia, unspecified: Secondary | ICD-10-CM

## 2022-07-24 DIAGNOSIS — E559 Vitamin D deficiency, unspecified: Secondary | ICD-10-CM

## 2022-07-24 MED ORDER — VITAMIN D (ERGOCALCIFEROL) 1.25 MG (50000 UNIT) PO CAPS: 50000.0000 [IU] | ORAL_CAPSULE | ORAL | 0 refills | Status: DC

## 2022-08-07 ENCOUNTER — Encounter (HOSPITAL_BASED_OUTPATIENT_CLINIC_OR_DEPARTMENT_OTHER): Payer: Self-pay | Admitting: Obstetrics and Gynecology

## 2022-08-09 ENCOUNTER — Encounter (HOSPITAL_BASED_OUTPATIENT_CLINIC_OR_DEPARTMENT_OTHER): Payer: Self-pay | Admitting: Obstetrics and Gynecology

## 2022-08-09 ENCOUNTER — Other Ambulatory Visit: Payer: Self-pay

## 2022-08-09 NOTE — Progress Notes (Signed)
Your procedure is scheduled on Thursday, 08/29/22.  Report to [facility].   Call this number if you have problems the morning of surgery  :[phone].   OUR ADDRESS IS [address].  WE ARE LOCATED IN [facility].  PLEASE BRING YOUR INSURANCE CARD AND PHOTO ID DAY OF SURGERY.  ONLY 2 PEOPLE ARE ALLOWED IN  WAITING  ROOM.                                      REMEMBER:  DO NOT EAT FOOD, CANDY GUM OR MINTS  AFTER MIDNIGHT THE NIGHT BEFORE YOUR SURGERY . YOU MAY HAVE CLEAR LIQUIDS FROM MIDNIGHT THE NIGHT BEFORE YOUR SURGERY UNTIL  4:30 AM. NO CLEAR LIQUIDS AFTER  4:30 AM DAY OF SURGERY.  YOU MAY  BRUSH YOUR TEETH MORNING OF SURGERY AND RINSE YOUR MOUTH OUT, NO CHEWING GUM CANDY OR MINTS.     CLEAR LIQUID DIET   Foods Allowed                                                                     Foods Excluded  Coffee and tea, regular and decaf                             liquids that you cannot  Plain Jell-O                                                                   see through such as: Fruit ices (not with fruit pulp)                                     milk, soups, orange juice  Plain  Popsicles                                    All solid food Carbonated beverages, regular and diet                                    Cranberry, grape and apple juices Sports drinks like Gatorade _____________________________________________________________________     TAKE THESE MEDICATIONS MORNING OF SURGERY: NONE    UP TO 4 VISITORS  MAY VISIT IN THE EXTENDED RECOVERY ROOM UNTIL 800 PM ONLY.  ONE  VISITOR AGE 12 AND OVER MAY SPEND THE NIGHT AND MUST BE IN EXTENDED RECOVERY ROOM NO LATER THAN 800 PM . YOUR DISCHARGE TIME AFTER YOU SPEND THE NIGHT IS 900 AM THE MORNING AFTER YOUR SURGERY.  YOU MAY PACK A SMALL OVERNIGHT BAG WITH TOILETRIES FOR YOUR OVERNIGHT STAY IF YOU  WISH.  YOUR PRESCRIPTION MEDICATIONS WILL BE PROVIDED DURING  [place].                                      DO NOT WEAR JEWERLY, MAKE UP. DO NOT WEAR LOTIONS, POWDERS, PERFUMES OR NAIL POLISH ON YOUR FINGERNAILS. TOENAIL POLISH IS OK TO WEAR. DO NOT SHAVE FOR 48 HOURS PRIOR TO DAY OF SURGERY. MEN MAY SHAVE FACE AND NECK. CONTACTS, GLASSES, OR DENTURES MAY NOT BE WORN TO SURGERY.  REMEMBER: NO SMOKING, DRUGS OR ALCOHOL FOR 24 HOURS BEFORE YOUR SURGERY.                                    [facility] IS NOT RESPONSIBLE  FOR ANY BELONGINGS.                                                                    [facility]           [facility] - Preparing for Surgery Before surgery, you can play an important role.  Because skin is not sterile, your skin needs to be as free of germs as possible.  You can reduce the number of germs on your skin by washing with CHG (chlorahexidine gluconate) soap before surgery.  CHG is an antiseptic cleaner which kills germs and bonds with the skin to continue killing germs even after washing. Please DO NOT use if you have an allergy to CHG or antibacterial soaps.  If your skin becomes reddened/irritated stop using the CHG and inform your nurse when you arrive at Short Stay. Do not shave (including legs and underarms) for at least 48 hours prior to the first CHG shower.  You may shave your face/neck. Please follow these instructions carefully:  1.  Shower with CHG Soap the night before surgery and the  morning of Surgery.  2.  If you choose to wash your hair, wash your hair first as usual with your  normal  shampoo.  3.  After you shampoo, rinse your hair and body thoroughly to remove the  shampoo.                            4.  Use CHG as you would any other liquid soap.  You can apply chg directly  to the skin and wash , please wash your belly button thoroughly with chg soap provided night before and morning of your surgery.                     Gently with a scrungie or clean washcloth.  5.  Apply the CHG Soap to your body  ONLY FROM THE NECK DOWN.   Do not use on face/ open                           Wound or open sores. Avoid contact with eyes, ears mouth and genitals (private parts).  Wash face,  Genitals (private parts) with your normal soap.             6.  Wash thoroughly, paying special attention to the area where your surgery  will be performed.  7.  Thoroughly rinse your body with warm water from the neck down.  8.  DO NOT shower/wash with your normal soap after using and rinsing off  the CHG Soap.                9.  Pat yourself dry with a clean towel.            10.  Wear clean pajamas.            11.  Place clean sheets on your bed the night of your first shower and do not  sleep with pets. Day of Surgery : Do not apply any lotions/deodorants the morning of surgery.  Please wear clean clothes to the hospital/surgery center.  IF YOU HAVE ANY SKIN IRRITATION OR PROBLEMS WITH THE SURGICAL SOAP, PLEASE GET A BAR OF GOLD DIAL SOAP AND SHOWER THE NIGHT BEFORE YOUR SURGERY AND THE MORNING OF YOUR SURGERY. PLEASE LET THE NURSE KNOW MORNING OF YOUR SURGERY IF YOU HAD ANY PROBLEMS WITH THE SURGICAL SOAP.   ________________________________________________________________________                                                        QUESTIONS [facility] PRE OP NURSE PHONE [phone].

## 2022-08-09 NOTE — Progress Notes (Signed)
Spoke w/ via phone for pre-op interview---Laura [name] needs dos---- none per anesthesia, surgeon orders pending as of 08/09/22              [name] results------08/27/22 [name] appt for cbc, type & screen COVID test -----patient states asymptomatic no test needed Arrive at -------0530 on Thursday, August 29, 2022 NPO after MN NO Solid Food.  Clear liquids from MN until---0430 Med rec completed Medications to take morning of surgery -----none Diabetic medication -----n/a Patient instructed no nail polish to be worn day of surgery Patient instructed to bring photo id and insurance card day of surgery Patient aware to have Driver (ride ) / caregiver    for 24 hours after surgery - husband, Laura [name] Patient Special Instructions -----Extended / overnight stay instructions given. Pre-Op special Istructions -----Requested orders from Dr. Sandford Craze on 08/07/22 via Epic IB. Patient verbalized understanding of instructions that were given at this phone interview. Patient denies shortness of breath, chest pain, fever, cough at this phone interview.

## 2022-08-14 ENCOUNTER — Ambulatory Visit: Payer: BC Managed Care – PPO

## 2022-08-22 ENCOUNTER — Ambulatory Visit
Admission: RE | Admit: 2022-08-22 | Discharge: 2022-08-22 | Disposition: A | Discharge status: Home / Self Care | Payer: BC Managed Care – PPO | Source: Ambulatory Visit | Attending: Family Medicine | Admitting: Family Medicine

## 2022-08-22 DIAGNOSIS — Z1231 Encounter for screening mammogram for malignant neoplasm of breast: Secondary | ICD-10-CM

## 2022-08-22 DIAGNOSIS — Z01818 Encounter for other preprocedural examination: Secondary | ICD-10-CM | POA: Diagnosis not present

## 2022-08-22 LAB — HM MAMMOGRAPHY

## 2022-08-27 ENCOUNTER — Encounter (HOSPITAL_COMMUNITY)
Admission: RE | Admit: 2022-08-27 | Discharge: 2022-08-27 | Disposition: A | Discharge status: Home / Self Care | Payer: BC Managed Care – PPO | Source: Ambulatory Visit | Attending: Obstetrics and Gynecology | Admitting: Obstetrics and Gynecology

## 2022-08-27 DIAGNOSIS — N72 Inflammatory disease of cervix uteri: Secondary | ICD-10-CM | POA: Diagnosis not present

## 2022-08-27 DIAGNOSIS — Z01818 Encounter for other preprocedural examination: Secondary | ICD-10-CM | POA: Diagnosis not present

## 2022-08-27 DIAGNOSIS — D251 Intramural leiomyoma of uterus: Secondary | ICD-10-CM | POA: Diagnosis not present

## 2022-08-27 DIAGNOSIS — K660 Peritoneal adhesions (postprocedural) (postinfection): Secondary | ICD-10-CM | POA: Diagnosis not present

## 2022-08-27 DIAGNOSIS — N838 Other noninflammatory disorders of ovary, fallopian tube and broad ligament: Secondary | ICD-10-CM | POA: Diagnosis not present

## 2022-08-27 LAB — CBC
HCT: 46.6 % — ABNORMAL HIGH (ref 36.0–46.0)
Hemoglobin: 15.1 g/dL — ABNORMAL HIGH (ref 12.0–15.0)
MCH: 28.4 pg (ref 26.0–34.0)
MCHC: 32.4 g/dL (ref 30.0–36.0)
MCV: 87.8 fL (ref 80.0–100.0)
Platelets: 238 10*3/uL (ref 150–400)
RBC: 5.31 MIL/uL — ABNORMAL HIGH (ref 3.87–5.11)
RDW: 12.9 % (ref 11.5–15.5)
WBC: 4.8 10*3/uL (ref 4.0–10.5)
nRBC: 0 % (ref 0.0–0.2)

## 2022-08-28 NOTE — H&P (Signed)
Laura Rice is an 56 y.o. female G2P2002 with history of multiple uterine fibroids, possible submucosal involvement and history of endometrial ablation with PMB, unable to evaluate with EMB os SIUS due to scarring of cavity.  Bleeding has stopped, but desires definitive management - will proceed with LAVH/BS/cysto.  D/W pt r/b/a, process and expectations - will proceed.   Pertinent Gynecological History: G2P2 - SVD x 2 - 7 1/2# babies No Abn pap - last 7/22 HR HPV neg No STI  Endometrial ablation 2008 Menstrual History:  No LMP recorded. Patient is postmenopausal.    Past Medical History:  Diagnosis Date   Colon polyps    colonoscopy 05/23/16  in [place] fever    Hyperlipidemia    Pt follows w/ PCP Dr. Grier Mitts, MD, [facility] 07/17/22.   Migraines    just treats with Tylenol OTC,  maybe one or two month   Peptic ulcer    diagnosed 1989, last issue with peptic ulcer around 2001 per pt on 8/18//23   PONV (postoperative nausea and vomiting)    hx of severe nausea & vomiting after surgery   Prediabetes    07/17/22 Hgb A1C 6.1   Uterine fibroid 2023   Wears glasses     Past Surgical History:  Procedure Laterality Date   NOVASURE ABLATION  2008   THYROIDECTOMY, PARTIAL  2010   TUBAL LIGATION  1993    Family History  Problem Relation Age of Onset   Alcohol abuse Brother    Diabetes Brother    Early death Brother    Alcohol abuse Daughter    Drug abuse Daughter    Asthma Son    Arthritis Maternal Grandmother    Diabetes Maternal Grandmother    Arthritis Maternal Grandfather    Asthma Paternal Grandmother    Early death Paternal Grandmother    Breast cancer Neg Hx     Social History:  reports that she has never smoked. She has never used smokeless tobacco. She reports current alcohol use. She reports that she does not use drugs. married  Allergies: No Known Allergies  Meds: Vit D, fishoil, women's MVI    Review of Systems  Constitutional: Negative.    HENT: Negative.    Respiratory: Negative.    Cardiovascular: Negative.   Gastrointestinal: Negative.   Genitourinary:  Positive for menstrual problem.  Musculoskeletal: Negative.   Skin: Negative.   Neurological: Negative.   Psychiatric/Behavioral: Negative.      Height '5\' 3"'$  (1.6 m), weight 79.4 kg. Physical Exam Constitutional:      Appearance: Normal appearance.  HENT:     Head: Normocephalic and atraumatic.  Cardiovascular:     Rate and Rhythm: Normal rate and regular rhythm.  Pulmonary:     Effort: Pulmonary effort is normal.     Breath sounds: Normal breath sounds.  Abdominal:     General: Bowel sounds are normal.     Palpations: Abdomen is soft.  Genitourinary:    Rectum: Normal.  Musculoskeletal:        General: Normal range of motion.     Cervical back: Normal range of motion.  Skin:    General: Skin is warm and dry.  Neurological:     General: No focal deficit present.     Mental Status: She is alert and oriented to person, place, and time.   ZO:[id number] uterus 8x8x8cm, multiple (at least 9) fibroids - most SS, IM, ? Submucosal involvement x 2; Question thickening of lining,  but s/p endometrial ablation, nl ovaries  MB - endocervical tissue only Assessment/Plan: 56yo G2P2 with AUB, uterine fibroids for LAVH/BS/cysto D/w pt r/b/a will proceed D/W pt post op expectations Prophylaxis w Ancef  Joshia Kitchings Bovard-Stuckert 08/28/2022, 10:42 AM

## 2022-08-29 ENCOUNTER — Other Ambulatory Visit: Payer: Self-pay

## 2022-08-29 ENCOUNTER — Observation Stay (HOSPITAL_BASED_OUTPATIENT_CLINIC_OR_DEPARTMENT_OTHER)
Admission: RE | Admit: 2022-08-29 | Discharge: 2022-08-29 | Disposition: A | Discharge status: Home / Self Care | Payer: BC Managed Care – PPO | Attending: Obstetrics and Gynecology | Admitting: Obstetrics and Gynecology

## 2022-08-29 ENCOUNTER — Observation Stay (HOSPITAL_BASED_OUTPATIENT_CLINIC_OR_DEPARTMENT_OTHER): Payer: BC Managed Care – PPO | Admitting: Certified Registered Nurse Anesthetist

## 2022-08-29 ENCOUNTER — Encounter (HOSPITAL_BASED_OUTPATIENT_CLINIC_OR_DEPARTMENT_OTHER)
Admission: RE | Disposition: A | Discharge status: Home / Self Care | Payer: Self-pay | Source: Home / Self Care | Attending: Obstetrics and Gynecology

## 2022-08-29 ENCOUNTER — Encounter (HOSPITAL_BASED_OUTPATIENT_CLINIC_OR_DEPARTMENT_OTHER): Payer: Self-pay | Admitting: Obstetrics and Gynecology

## 2022-08-29 DIAGNOSIS — N858 Other specified noninflammatory disorders of uterus: Secondary | ICD-10-CM | POA: Diagnosis not present

## 2022-08-29 DIAGNOSIS — Z90710 Acquired absence of both cervix and uterus: Secondary | ICD-10-CM | POA: Diagnosis present

## 2022-08-29 DIAGNOSIS — N72 Inflammatory disease of cervix uteri: Secondary | ICD-10-CM | POA: Insufficient documentation

## 2022-08-29 DIAGNOSIS — D252 Subserosal leiomyoma of uterus: Secondary | ICD-10-CM | POA: Diagnosis not present

## 2022-08-29 DIAGNOSIS — D259 Leiomyoma of uterus, unspecified: Secondary | ICD-10-CM | POA: Diagnosis not present

## 2022-08-29 DIAGNOSIS — D251 Intramural leiomyoma of uterus: Principal | ICD-10-CM | POA: Insufficient documentation

## 2022-08-29 DIAGNOSIS — N838 Other noninflammatory disorders of ovary, fallopian tube and broad ligament: Secondary | ICD-10-CM | POA: Insufficient documentation

## 2022-08-29 DIAGNOSIS — Z01818 Encounter for other preprocedural examination: Secondary | ICD-10-CM

## 2022-08-29 DIAGNOSIS — K660 Peritoneal adhesions (postprocedural) (postinfection): Secondary | ICD-10-CM | POA: Insufficient documentation

## 2022-08-29 HISTORY — DX: Leiomyoma of uterus, unspecified: D25.9

## 2022-08-29 HISTORY — PX: CYSTOSCOPY: SHX5120

## 2022-08-29 HISTORY — DX: Presence of spectacles and contact lenses: Z97.3

## 2022-08-29 HISTORY — DX: Other specified postprocedural states: Z98.890

## 2022-08-29 HISTORY — PX: LAPAROSCOPIC LYSIS OF ADHESIONS: SHX5905

## 2022-08-29 HISTORY — DX: Prediabetes: R73.03

## 2022-08-29 HISTORY — PX: LAPAROSCOPIC VAGINAL HYSTERECTOMY WITH SALPINGECTOMY: SHX6680

## 2022-08-29 HISTORY — DX: Other specified postprocedural states: R11.2

## 2022-08-29 LAB — BASIC METABOLIC PANEL
Anion gap: 10 (ref 5–15)
BUN: 15 mg/dL (ref 6–20)
CO2: 26 mmol/L (ref 22–32)
Calcium: 8.8 mg/dL — ABNORMAL LOW (ref 8.9–10.3)
Chloride: 102 mmol/L (ref 98–111)
Creatinine, Ser: 1.05 mg/dL — ABNORMAL HIGH (ref 0.44–1.00)
GFR, Estimated: >60 mL/min (ref >60)
Glucose, Bld: 138 mg/dL — ABNORMAL HIGH (ref 70–99)
Potassium: 4.7 mmol/L (ref 3.5–5.1)
Sodium: 138 mmol/L (ref 135–145)

## 2022-08-29 LAB — TYPE AND SCREEN
ABO/RH(D): O POS
Antibody Screen: NEGATIVE

## 2022-08-29 LAB — COMPREHENSIVE METABOLIC PANEL
ALT: 31 U/L (ref 0–44)
AST: 34 U/L (ref 15–41)
Albumin: 4.3 g/dL (ref 3.5–5.0)
Alkaline Phosphatase: 50 U/L (ref 38–126)
Anion gap: 9 (ref 5–15)
BUN: 16 mg/dL (ref 6–20)
CO2: 24 mmol/L (ref 22–32)
Calcium: 9.6 mg/dL (ref 8.9–10.3)
Chloride: 106 mmol/L (ref 98–111)
Creatinine, Ser: 1 mg/dL (ref 0.44–1.00)
GFR, Estimated: >60 mL/min (ref >60)
Glucose, Bld: 110 mg/dL — ABNORMAL HIGH (ref 70–99)
Potassium: 4 mmol/L (ref 3.5–5.1)
Sodium: 139 mmol/L (ref 135–145)
Total Bilirubin: 1 mg/dL (ref 0.3–1.2)
Total Protein: 7.8 g/dL (ref 6.5–8.1)

## 2022-08-29 LAB — CBC
HCT: 42.9 % (ref 36.0–46.0)
Hemoglobin: 13.8 g/dL (ref 12.0–15.0)
MCH: 28.5 pg (ref 26.0–34.0)
MCHC: 32.2 g/dL (ref 30.0–36.0)
MCV: 88.6 fL (ref 80.0–100.0)
Platelets: 224 10*3/uL (ref 150–400)
RBC: 4.84 MIL/uL (ref 3.87–5.11)
RDW: 12.8 % (ref 11.5–15.5)
WBC: 13.8 10*3/uL — ABNORMAL HIGH (ref 4.0–10.5)
nRBC: 0 % (ref 0.0–0.2)

## 2022-08-29 LAB — ABO/RH: ABO/RH(D): O POS

## 2022-08-29 LAB — POCT PREGNANCY, URINE: Preg Test, Ur: NEGATIVE

## 2022-08-29 SURGERY — HYSTERECTOMY, VAGINAL, LAPAROSCOPY-ASSISTED, WITH SALPINGECTOMY
Anesthesia: General

## 2022-08-29 MED ORDER — ROCURONIUM BROMIDE 10 MG/ML (PF) SYRINGE
PREFILLED_SYRINGE | INTRAVENOUS | Status: AC
  Filled 2022-08-29: qty 10

## 2022-08-29 MED ORDER — PHENYLEPHRINE 80 MCG/ML (10ML) SYRINGE FOR IV PUSH (FOR BLOOD PRESSURE SUPPORT)
PREFILLED_SYRINGE | INTRAVENOUS | Status: AC
  Filled 2022-08-29: qty 10

## 2022-08-29 MED ORDER — KETAMINE HCL 10 MG/ML IJ SOLN
INTRAMUSCULAR | Status: DC | PRN
  Administered 2022-08-29: 20 mg via INTRAVENOUS
  Administered 2022-08-29: 10 mg via INTRAVENOUS

## 2022-08-29 MED ORDER — PROPOFOL 1000 MG/100ML IV EMUL
INTRAVENOUS | Status: AC
  Filled 2022-08-29: qty 100

## 2022-08-29 MED ORDER — ACETAMINOPHEN 500 MG PO TABS
1000.0000 mg | ORAL_TABLET | ORAL | Status: AC
  Administered 2022-08-29: 1000 mg via ORAL

## 2022-08-29 MED ORDER — HYDROMORPHONE HCL 1 MG/ML IJ SOLN: 0.2000 mg | INTRAMUSCULAR | Status: DC | PRN

## 2022-08-29 MED ORDER — ONDANSETRON HCL 4 MG/2ML IJ SOLN: 4.0000 mg | Freq: Four times a day (QID) | INTRAMUSCULAR | Status: DC | PRN

## 2022-08-29 MED ORDER — CEFAZOLIN SODIUM-DEXTROSE 2-4 GM/100ML-% IV SOLN
INTRAVENOUS | Status: AC
  Filled 2022-08-29: qty 100

## 2022-08-29 MED ORDER — LACTATED RINGERS IV SOLN: INTRAVENOUS | Status: DC

## 2022-08-29 MED ORDER — IBUPROFEN 800 MG PO TABS
800.0000 mg | ORAL_TABLET | Freq: Three times a day (TID) | ORAL | Status: DC | PRN
  Administered 2022-08-29: 800 mg via ORAL

## 2022-08-29 MED ORDER — GABAPENTIN 300 MG PO CAPS
300.0000 mg | ORAL_CAPSULE | ORAL | Status: AC
  Administered 2022-08-29: 300 mg via ORAL

## 2022-08-29 MED ORDER — DIPHENHYDRAMINE HCL 12.5 MG/5ML PO ELIX: 12.5000 mg | ORAL_SOLUTION | Freq: Four times a day (QID) | ORAL | Status: DC | PRN

## 2022-08-29 MED ORDER — KETAMINE HCL 50 MG/5ML IJ SOSY
PREFILLED_SYRINGE | INTRAMUSCULAR | Status: AC
  Filled 2022-08-29: qty 5

## 2022-08-29 MED ORDER — SCOPOLAMINE 1 MG/3DAYS TD PT72
1.0000 | MEDICATED_PATCH | TRANSDERMAL | Status: DC
  Administered 2022-08-29: 1 via TRANSDERMAL

## 2022-08-29 MED ORDER — POVIDONE-IODINE 10 % EX SWAB: 2.0000 | Freq: Once | CUTANEOUS | Status: DC

## 2022-08-29 MED ORDER — KETOROLAC TROMETHAMINE 30 MG/ML IJ SOLN
INTRAMUSCULAR | Status: DC | PRN
  Administered 2022-08-29: 30 mg via INTRAVENOUS

## 2022-08-29 MED ORDER — HYDROMORPHONE 1 MG/ML IV SOLN
INTRAVENOUS | Status: DC
  Filled 2022-08-29: qty 30

## 2022-08-29 MED ORDER — ONDANSETRON HCL 4 MG/2ML IJ SOLN
INTRAMUSCULAR | Status: AC
  Filled 2022-08-29: qty 2

## 2022-08-29 MED ORDER — OXYCODONE-ACETAMINOPHEN 5-325 MG PO TABS
1.0000 | ORAL_TABLET | ORAL | Status: DC | PRN
  Administered 2022-08-29: 2 via ORAL

## 2022-08-29 MED ORDER — IBUPROFEN 800 MG PO TABS
ORAL_TABLET | ORAL | Status: AC
  Filled 2022-08-29: qty 1

## 2022-08-29 MED ORDER — PROPOFOL 500 MG/50ML IV EMUL
INTRAVENOUS | Status: DC | PRN
  Administered 2022-08-29: 200 ug/kg/min via INTRAVENOUS

## 2022-08-29 MED ORDER — MIDAZOLAM HCL 5 MG/5ML IJ SOLN
INTRAMUSCULAR | Status: DC | PRN
  Administered 2022-08-29: 2 mg via INTRAVENOUS

## 2022-08-29 MED ORDER — OXYCODONE-ACETAMINOPHEN 5-325 MG PO TABS
ORAL_TABLET | ORAL | Status: AC
  Filled 2022-08-29: qty 2

## 2022-08-29 MED ORDER — PROPOFOL 10 MG/ML IV BOLUS
INTRAVENOUS | Status: DC | PRN
  Administered 2022-08-29: 150 mg via INTRAVENOUS
  Administered 2022-08-29: 10 mg via INTRAVENOUS
  Administered 2022-08-29: 20 mg via INTRAVENOUS

## 2022-08-29 MED ORDER — NALOXONE HCL 0.4 MG/ML IJ SOLN: 0.4000 mg | INTRAMUSCULAR | Status: DC | PRN

## 2022-08-29 MED ORDER — GLYCOPYRROLATE PF 0.2 MG/ML IJ SOSY
PREFILLED_SYRINGE | INTRAMUSCULAR | Status: AC
  Filled 2022-08-29: qty 1

## 2022-08-29 MED ORDER — SIMETHICONE 80 MG PO CHEW
CHEWABLE_TABLET | ORAL | Status: AC
  Filled 2022-08-29: qty 1

## 2022-08-29 MED ORDER — LACTATED RINGERS IV BOLUS
500.0000 mL | Freq: Once | INTRAVENOUS | Status: AC
  Administered 2022-08-29: 500 mL via INTRAVENOUS

## 2022-08-29 MED ORDER — ALUM & MAG HYDROXIDE-SIMETH 200-200-20 MG/5ML PO SUSP: 30.0000 mL | ORAL | Status: DC | PRN

## 2022-08-29 MED ORDER — GUAIFENESIN 100 MG/5ML PO LIQD
15.0000 mL | ORAL | Status: DC | PRN
  Filled 2022-08-29: qty 15

## 2022-08-29 MED ORDER — BUPIVACAINE HCL (PF) 0.25 % IJ SOLN
INTRAMUSCULAR | Status: DC | PRN
  Administered 2022-08-29: 10 mL

## 2022-08-29 MED ORDER — OXYCODONE-ACETAMINOPHEN 5-325 MG PO TABS: 1.0000 | ORAL_TABLET | Freq: Four times a day (QID) | ORAL | 0 refills | Status: DC | PRN

## 2022-08-29 MED ORDER — SUGAMMADEX SODIUM 200 MG/2ML IV SOLN
INTRAVENOUS | Status: DC | PRN
  Administered 2022-08-29 (×2): 100 mg via INTRAVENOUS

## 2022-08-29 MED ORDER — LIDOCAINE 2% (20 MG/ML) 5 ML SYRINGE
INTRAMUSCULAR | Status: DC | PRN
  Administered 2022-08-29: 60 mg via INTRAVENOUS

## 2022-08-29 MED ORDER — CEFAZOLIN SODIUM-DEXTROSE 2-4 GM/100ML-% IV SOLN
2.0000 g | INTRAVENOUS | Status: AC
  Administered 2022-08-29: 2 g via INTRAVENOUS

## 2022-08-29 MED ORDER — SODIUM CHLORIDE 0.9% FLUSH: 9.0000 mL | INTRAVENOUS | Status: DC | PRN

## 2022-08-29 MED ORDER — GABAPENTIN 300 MG PO CAPS
ORAL_CAPSULE | ORAL | Status: AC
  Filled 2022-08-29: qty 1

## 2022-08-29 MED ORDER — LIDOCAINE HCL (PF) 2 % IJ SOLN
INTRAMUSCULAR | Status: AC
  Filled 2022-08-29: qty 5

## 2022-08-29 MED ORDER — ONDANSETRON HCL 4 MG/2ML IJ SOLN
INTRAMUSCULAR | Status: DC | PRN
  Administered 2022-08-29: 4 mg via INTRAVENOUS

## 2022-08-29 MED ORDER — SODIUM CHLORIDE 0.9 % IR SOLN
Status: DC | PRN
  Administered 2022-08-29: 1 via INTRAVESICAL

## 2022-08-29 MED ORDER — FENTANYL CITRATE (PF) 100 MCG/2ML IJ SOLN
25.0000 ug | INTRAMUSCULAR | Status: DC | PRN
  Administered 2022-08-29: 50 ug via INTRAVENOUS
  Administered 2022-08-29: 25 ug via INTRAVENOUS

## 2022-08-29 MED ORDER — SIMETHICONE 80 MG PO CHEW
80.0000 mg | CHEWABLE_TABLET | Freq: Four times a day (QID) | ORAL | Status: DC | PRN
  Administered 2022-08-29: 80 mg via ORAL

## 2022-08-29 MED ORDER — IBUPROFEN 800 MG PO TABS: 800.0000 mg | ORAL_TABLET | Freq: Three times a day (TID) | ORAL | 1 refills | Status: DC | PRN

## 2022-08-29 MED ORDER — ARTIFICIAL TEARS OPHTHALMIC OINT
TOPICAL_OINTMENT | OPHTHALMIC | Status: AC
  Filled 2022-08-29: qty 3.5

## 2022-08-29 MED ORDER — MENTHOL 3 MG MT LOZG: 1.0000 | LOZENGE | OROMUCOSAL | Status: DC | PRN

## 2022-08-29 MED ORDER — MIDAZOLAM HCL 2 MG/2ML IJ SOLN
INTRAMUSCULAR | Status: AC
  Filled 2022-08-29: qty 2

## 2022-08-29 MED ORDER — DEXAMETHASONE SODIUM PHOSPHATE 10 MG/ML IJ SOLN
INTRAMUSCULAR | Status: AC
  Filled 2022-08-29: qty 1

## 2022-08-29 MED ORDER — FENTANYL CITRATE (PF) 250 MCG/5ML IJ SOLN
INTRAMUSCULAR | Status: AC
  Filled 2022-08-29: qty 5

## 2022-08-29 MED ORDER — SCOPOLAMINE 1 MG/3DAYS TD PT72
MEDICATED_PATCH | TRANSDERMAL | Status: AC
  Filled 2022-08-29: qty 1

## 2022-08-29 MED ORDER — DIPHENHYDRAMINE HCL 50 MG/ML IJ SOLN: 12.5000 mg | Freq: Four times a day (QID) | INTRAMUSCULAR | Status: DC | PRN

## 2022-08-29 MED ORDER — ROCURONIUM BROMIDE 10 MG/ML (PF) SYRINGE
PREFILLED_SYRINGE | INTRAVENOUS | Status: DC | PRN
  Administered 2022-08-29 (×3): 10 mg via INTRAVENOUS
  Administered 2022-08-29: 70 mg via INTRAVENOUS
  Administered 2022-08-29: 10 mg via INTRAVENOUS

## 2022-08-29 MED ORDER — PHENYLEPHRINE 80 MCG/ML (10ML) SYRINGE FOR IV PUSH (FOR BLOOD PRESSURE SUPPORT)
PREFILLED_SYRINGE | INTRAVENOUS | Status: DC | PRN
  Administered 2022-08-29: 80 ug via INTRAVENOUS
  Administered 2022-08-29: 160 ug via INTRAVENOUS
  Administered 2022-08-29 (×2): 80 ug via INTRAVENOUS

## 2022-08-29 MED ORDER — DEXAMETHASONE SODIUM PHOSPHATE 10 MG/ML IJ SOLN
INTRAMUSCULAR | Status: DC | PRN
  Administered 2022-08-29: 10 mg via INTRAVENOUS

## 2022-08-29 MED ORDER — PHENYLEPHRINE HCL (PRESSORS) 10 MG/ML IV SOLN
INTRAVENOUS | Status: AC
  Filled 2022-08-29: qty 1

## 2022-08-29 MED ORDER — PHENYLEPHRINE HCL-NACL 20-0.9 MG/250ML-% IV SOLN
INTRAVENOUS | Status: DC | PRN
  Administered 2022-08-29: 120 ug/min via INTRAVENOUS

## 2022-08-29 MED ORDER — FENTANYL CITRATE (PF) 250 MCG/5ML IJ SOLN
INTRAMUSCULAR | Status: DC | PRN
  Administered 2022-08-29: 100 ug via INTRAVENOUS
  Administered 2022-08-29 (×3): 50 ug via INTRAVENOUS

## 2022-08-29 MED ORDER — PROPOFOL 10 MG/ML IV BOLUS
INTRAVENOUS | Status: AC
  Filled 2022-08-29: qty 20

## 2022-08-29 MED ORDER — FENTANYL CITRATE (PF) 100 MCG/2ML IJ SOLN
INTRAMUSCULAR | Status: AC
  Filled 2022-08-29: qty 2

## 2022-08-29 MED ORDER — ACETAMINOPHEN 500 MG PO TABS
ORAL_TABLET | ORAL | Status: AC
  Filled 2022-08-29: qty 2

## 2022-08-29 MED ORDER — VASOPRESSIN 20 UNIT/ML IV SOLN
INTRAVENOUS | Status: DC | PRN
  Administered 2022-08-29: 20 mL via INTRAMUSCULAR

## 2022-08-29 MED ORDER — GLYCOPYRROLATE PF 0.2 MG/ML IJ SOSY
PREFILLED_SYRINGE | INTRAMUSCULAR | Status: DC | PRN
  Administered 2022-08-29: .2 mg via INTRAVENOUS

## 2022-08-29 MED ORDER — ONDANSETRON HCL 4 MG PO TABS: 4.0000 mg | ORAL_TABLET | Freq: Four times a day (QID) | ORAL | Status: DC | PRN

## 2022-08-29 SURGICAL SUPPLY — 45 items
ADH SKN CLS APL DERMABOND .7 (GAUZE/BANDAGES/DRESSINGS) ×3
APL SWBSTK 6 STRL LF DISP (MISCELLANEOUS) ×3
APPLICATOR COTTON TIP 6 STRL (MISCELLANEOUS) IMPLANT
APPLICATOR COTTON TIP 6IN STRL (MISCELLANEOUS) ×3
CATH FOLEY 2WAY SLVR  5CC 12FR (CATHETERS) ×3
CATH FOLEY 2WAY SLVR 5CC 12FR (CATHETERS) IMPLANT
CNTNR URN SCR LID CUP LEK RST (MISCELLANEOUS) ×3 IMPLANT
CONT SPEC 4OZ STRL OR WHT (MISCELLANEOUS) ×3
COVER BACK TABLE 60X90IN (DRAPES) ×3 IMPLANT
COVER MAYO STAND STRL (DRAPES) ×6 IMPLANT
DERMABOND ADVANCED (GAUZE/BANDAGES/DRESSINGS) ×3
DERMABOND ADVANCED .7 DNX12 (GAUZE/BANDAGES/DRESSINGS) ×3 IMPLANT
DURAPREP 26ML APPLICATOR (WOUND CARE) ×3 IMPLANT
ELECT REM PT RETURN 9FT ADLT (ELECTROSURGICAL) ×3
ELECTRODE REM PT RTRN 9FT ADLT (ELECTROSURGICAL) ×3 IMPLANT
GAUZE 4X4 16PLY ~~LOC~~+RFID DBL (SPONGE) ×9 IMPLANT
GLOVE BIO SURGEON STRL SZ 6.5 (GLOVE) ×9 IMPLANT
HIBICLENS CHG 4% 4OZ BTL (MISCELLANEOUS) ×3 IMPLANT
KIT TURNOVER CYSTO (KITS) ×3 IMPLANT
NDL INSUFFLATION 14GA 120MM (NEEDLE) ×3 IMPLANT
NEEDLE INSUFFLATION 14GA 120MM (NEEDLE) ×3 IMPLANT
NS IRRIG 1000ML POUR BTL (IV SOLUTION) ×3 IMPLANT
PACK LAVH (CUSTOM PROCEDURE TRAY) ×3 IMPLANT
PACK ROBOTIC GOWN (GOWN DISPOSABLE) ×3 IMPLANT
PACK TRENDGUARD 450 HYBRID PRO (MISCELLANEOUS) ×3 IMPLANT
PAD OB MATERNITY 4.3X12.25 (PERSONAL CARE ITEMS) ×3 IMPLANT
SET IRRIG Y TYPE TUR BLADDER L (SET/KITS/TRAYS/PACK) ×3 IMPLANT
SET SUCTION IRRIG HYDROSURG (IRRIGATION / IRRIGATOR) ×3 IMPLANT
SET TUBE SMOKE EVAC HIGH FLOW (TUBING) ×3 IMPLANT
SHEARS HARMONIC ACE PLUS 36CM (ENDOMECHANICALS) ×3 IMPLANT
SLEEVE Z-THREAD 5X100MM (TROCAR) IMPLANT
SPONGE T-LAP 4X18 ~~LOC~~+RFID (SPONGE) ×3 IMPLANT
SUT VIC AB 1 CT1 18XBRD ANBCTR (SUTURE) ×6 IMPLANT
SUT VIC AB 1 CT1 8-18 (SUTURE) ×6
SUT VIC AB 2-0 CT1 (SUTURE) ×3 IMPLANT
SUT VIC AB 4-0 PS2 27 (SUTURE) ×3 IMPLANT
SUT VICRYL 0 TIES 12 18 (SUTURE) ×3 IMPLANT
SUT VICRYL 0 UR6 27IN ABS (SUTURE) IMPLANT
TOWEL OR 17X26 10 PK STRL BLUE (TOWEL DISPOSABLE) ×3 IMPLANT
TRAY FOLEY W/BAG SLVR 14FR LF (SET/KITS/TRAYS/PACK) ×3 IMPLANT
TRENDGUARD 450 HYBRID PRO PACK (MISCELLANEOUS) ×3
TROCAR BALLN 12MMX100 BLUNT (TROCAR) IMPLANT
TROCAR PORT AIRSEAL 5X120 (TROCAR) IMPLANT
TROCAR Z-THREAD FIOS 5X100MM (TROCAR) IMPLANT
WARMER LAPAROSCOPE (MISCELLANEOUS) ×3 IMPLANT

## 2022-08-29 NOTE — Transfer of Care (Signed)
Immediate Anesthesia Transfer of Care Note  Patient: Laura Rice  Procedure(s) Performed: LAPAROSCOPIC ASSISTED VAGINAL HYSTERECTOMY WITH SALPINGECTOMY (Bilateral) CYSTOSCOPY LAPAROSCOPIC LYSIS OF ADHESIONS  Patient Location: PACU  Anesthesia Type:General  Level of Consciousness: awake, alert , oriented and patient cooperative  Airway & Oxygen Therapy: Patient Spontanous Breathing and Patient connected to face mask oxygen  Post-op Assessment: Report given to RN and Post -op Vital signs reviewed and stable  Post vital signs: Reviewed and stable  Last Vitals:  Vitals Value Taken Time  BP 100/63 08/29/22 1045  Temp    Pulse 74 08/29/22 1054  Resp 30 08/29/22 1054  SpO2 97 % 08/29/22 1054  Vitals shown include unvalidated device data.  Last Pain:  Vitals:   08/29/22 0034  TempSrc: Oral  PainSc: 0-No pain      Patients Stated Pain Goal: 5 (91/79/15 0569)  Complications: No notable events documented.

## 2022-08-29 NOTE — Brief Op Note (Signed)
08/29/2022  10:42 AM  PATIENT:  Laura Rice  56 y.o. female  PRE-OPERATIVE DIAGNOSIS:  uterine leiomyoma  POST-OPERATIVE DIAGNOSIS:  uterine leiomyoma, multiple fibroids, abdominal adhersions  PROCEDURE:  Procedure(s): LAPAROSCOPIC ASSISTED VAGINAL HYSTERECTOMY WITH SALPINGECTOMY (Bilateral) CYSTOSCOPY (N/A) LAPAROSCOPIC LYSIS OF ADHESIONS  SURGEON:  Surgeon(s) and Role:    * Bovard-Stuckert, Panayiotis Rainville, MD - Primary    * Banga, Cecilia Worema, DO - Assisting  ANESTHESIA:   local and general  EBL:  150 mL uop and IVF per anesthesia  DRAINS: Urinary Catheter (Foley)   LOCAL MEDICATIONS USED:  MARCAINE    and OTHER vasopressin   SPECIMEN:  Source of Specimen:  uterus, cervix, B tubal stumps, B fimbrae,  DISPOSITION OF SPECIMEN:  PATHOLOGY  COUNTS:  YES  TOURNIQUET:  * No tourniquets in log *  DICTATION: .Other Dictation: Dictation Number [id number]  PLAN OF CARE: Admit for overnight observation  PATIENT DISPOSITION:  PACU - hemodynamically stable.   Delay start of Pharmacological VTE agent (>24hrs) due to surgical blood loss or risk of bleeding: not applicable

## 2022-08-29 NOTE — Anesthesia Procedure Notes (Signed)
Procedure Name: Intubation Date/Time: 08/29/2022 7:29 AM  Performed by: Rogers Blocker, CRNAPre-anesthesia Checklist: Patient identified, Emergency Drugs available, Suction available and Patient being monitored Patient Re-evaluated:Patient Re-evaluated prior to induction Oxygen Delivery Method: Circle System Utilized Preoxygenation: Pre-oxygenation with 100% oxygen Induction Type: IV induction Ventilation: Mask ventilation without difficulty Laryngoscope Size: Mac and 3 Grade View: Grade I Tube type: Oral Tube size: 7.0 mm Number of attempts: 1 Airway Equipment and Method: Stylet and Bite block Placement Confirmation: ETT inserted through vocal cords under direct vision, positive ETCO2 and breath sounds checked- equal and bilateral Secured at: 21 cm Tube secured with: Tape Dental Injury: Teeth and Oropharynx as per pre-operative assessment

## 2022-08-29 NOTE — Discharge Summary (Signed)
Physician Discharge Summary  Patient ID: Laura Rice MRN: [id number] DOB/AGE: 25-Sep-1966 56 y.o.  Admit date: 08/29/2022 Discharge date: 08/29/2022  Admission Diagnoses:  Discharge Diagnoses:  Principal Problem:   S/P laparoscopic assisted vaginal hysterectomy (LAVH)   Discharged Condition: good  Hospital Course: Admitted for LAVH/BS/cysto - underwent with some difficulty dues to fibroids and placement.  Labs are stable.  Eating, voiding, tolerating po and pain controlled.    Consults: None  Significant Diagnostic Studies: labs: CBC, BMP  Treatments: surgery: LAVH/BS/cysto  Discharge Exam: Blood pressure 122/72, pulse 70, temperature 97.7 F (36.5 C), resp. rate 15, height '5\' 3"'$  (1.6 m), weight 78.6 kg, SpO2 98 %. General appearance: alert and no distress Resp: clear to auscultation bilaterally Cardio: regular rate and rhythm GI: soft, non-tender; bowel sounds normal; no masses,  no organomegaly Extremities: extremities normal, atraumatic, no cyanosis or edema Incision/Wound: C/D/I  Disposition: Discharge disposition: 01-Home or Self Care       Discharge Instructions     Call MD for:  persistant nausea and vomiting   Complete by: As directed    Call MD for:  redness, tenderness, or signs of infection (pain, swelling, redness, odor or green/yellow discharge around incision site)   Complete by: As directed    Call MD for:  severe uncontrolled pain   Complete by: As directed    Diet - low sodium heart healthy   Complete by: As directed    Discharge instructions   Complete by: As directed    Call [phone] with questions or problems   Driving Restrictions   Complete by: As directed    While taking strong pain medicines   Increase activity slowly   Complete by: As directed    Lifting restrictions   Complete by: As directed    No greater than 10-15lbs for 6 weeks   May shower / Bathe   Complete by: As directed    May walk up steps   Complete by: As  directed    No dressing needed   Complete by: As directed    Dermabond (glue) will peel, you may see pieces of suture   No wound care   Complete by: As directed    Sexual Activity Restrictions   Complete by: As directed    Pelvic rest - no douching, tampons or sex for 6 weeks      Allergies as of 08/29/2022   No Known Allergies      Medication List     TAKE these medications    CRANBERRY PO Take by mouth.   FISH OIL CONCENTRATE PO Take by mouth.   HAIR SKIN & NAILS GUMMIES PO Take by mouth.   ibuprofen 800 MG tablet Commonly known as: ADVIL Take 1 tablet (800 mg total) by mouth every 8 (eight) hours as needed for moderate pain (mild pain).   MULTIVITAMIN ADULTS 50+ PO Take by mouth daily. Gummy   oxyCODONE-acetaminophen 5-325 MG tablet Commonly known as: PERCOCET/ROXICET Take 1-2 tablets by mouth every 6 (six) hours as needed for severe pain (moderate to severe pain (when tolerating fluids)).   Vitamin D (Ergocalciferol) 1.25 MG (50000 UNIT) Caps capsule Commonly known as: DRISDOL Take 1 capsule (50,000 Units total) by mouth every 7 (seven) days.               Discharge Care Instructions  (From admission, onward)           Start     Ordered   08/29/22 0000  No dressing needed       Comments: Dermabond (glue) will peel, you may see pieces of suture   08/29/22 1710            Follow-up Information     Bovard-Stuckert, Avaeh Ewer, MD. Call in 2 week(s).   Specialty: Obstetrics and Gynecology Why: 2 and 6 weeks for postop checks Contact information: [place] [address] [place] [address] [phone]                 Signed: Janyth Contes 08/29/2022, 5:11 PM

## 2022-08-29 NOTE — Interval H&P Note (Signed)
History and Physical Interval Note:  08/29/2022 7:07 AM  [place]  has presented today for surgery, with the diagnosis of uterine leiomyoma.  The various methods of treatment have been discussed with the patient and family. After consideration of risks, benefits and other options for treatment, the patient has consented to  Procedure(s): LAPAROSCOPIC ASSISTED VAGINAL HYSTERECTOMY WITH SALPINGECTOMY (Bilateral) CYSTOSCOPY (N/A) as a surgical intervention.  The patient's history has been reviewed, patient examined, no change in status, stable for surgery.  I have reviewed the patient's chart and labs.  Questions were answered to the patient's satisfaction.     Alekxander Isola Bovard-Stuckert

## 2022-08-29 NOTE — Progress Notes (Signed)
Day of Surgery Procedure(s) (LRB): LAPAROSCOPIC ASSISTED VAGINAL HYSTERECTOMY WITH SALPINGECTOMY (Bilateral) CYSTOSCOPY (N/A) LAPAROSCOPIC LYSIS OF ADHESIONS  Subjective: Patient reports incisional pain, tolerating PO, Ambulating.  Pain controlled.  Awating voiding  Objective: I have reviewed patient's vital signs, intake and output, and medications. (Awaiting labs) Hgb appropriate decrease  General: alert and no distress Resp: clear to auscultation bilaterally Cardio: regular rate and rhythm GI: soft, non-tender; bowel sounds normal; no masses,  no organomegaly Extremities: extremities normal, atraumatic, no cyanosis or edema Inc: C/D/I  Assessment: s/p Procedure(s): LAPAROSCOPIC ASSISTED VAGINAL HYSTERECTOMY WITH SALPINGECTOMY (Bilateral) CYSTOSCOPY (N/A) LAPAROSCOPIC LYSIS OF ADHESIONS: stable and progressing well  Plan: Encourage ambulation Discharge home when voids and if labs are stable.    Nurses to call for discharge with labs and voiding.    LOS: 0 days    Janyth Contes, MD 08/29/2022, 4:46 PM

## 2022-08-29 NOTE — Anesthesia Preprocedure Evaluation (Signed)
Anesthesia Evaluation  Patient identified by MRN, date of birth, ID band Patient awake    Reviewed: Allergy & Precautions, NPO status , Patient's Chart, lab work & pertinent test results  History of Anesthesia Complications (+) PONV and history of anesthetic complications  Airway Mallampati: II  TM Distance: >3 FB Neck ROM: Full    Dental no notable dental hx.    Pulmonary asthma ,    Pulmonary exam normal breath sounds clear to auscultation       Cardiovascular negative cardio ROS Normal cardiovascular exam Rhythm:Regular Rate:Normal  HLD   Neuro/Psych  Headaches, negative psych ROS   GI/Hepatic Neg liver ROS, PUD,   Endo/Other  negative endocrine ROS  Renal/GU negative Renal ROS  negative genitourinary   Musculoskeletal negative musculoskeletal ROS (+)   Abdominal   Peds  Hematology negative hematology ROS (+)   Anesthesia Other Findings   Reproductive/Obstetrics                             Anesthesia Physical Anesthesia Plan  ASA: 2  Anesthesia Plan: General   Post-op Pain Management: Ketamine IV* and Tylenol PO (pre-op)*   Induction: Intravenous  PONV Risk Score and Plan: 4 or greater and Midazolam, Dexamethasone, Ondansetron, Scopolamine patch - Pre-op and TIVA  Airway Management Planned: Oral ETT  Additional Equipment:   Intra-op Plan:   Post-operative Plan: Extubation in OR  Informed Consent: I have reviewed the patients History and Physical, chart, labs and discussed the procedure including the risks, benefits and alternatives for the proposed anesthesia with the patient or authorized representative who has indicated his/her understanding and acceptance.     Dental advisory given  Plan Discussed with: CRNA  Anesthesia Plan Comments:         Anesthesia Quick Evaluation

## 2022-08-29 NOTE — Op Note (Unsigned)
Laura Rice, Laura Rice MEDICAL RECORD NO: [id number] ACCOUNT NO: [id number] DATE OF BIRTH: 03-15-1966 FACILITY: [facility] LOCATION: [facility] PHYSICIAN: Janyth Contes, MD  Operative Report   DATE OF PROCEDURE: 08/29/2022  PREOPERATIVE DIAGNOSES:  Uterine leiomyoma as well as abdominal adhesions.  PROCEDURE:  Laparoscopic-assisted vaginal hysterectomy with bilateral salpingectomy, cystoscopy and lysis of adhesions.  SURGEON:  Janyth Contes, MD  ASSISTANT:  Carlynn Purl, DO  ESTIMATED BLOOD LOSS:  Approximately 150 mL.  IV FLUIDS AND URINE OUTPUT:  Per Anesthesia.  COMPLICATIONS:  None.  PATHOLOGY:  Uterus, cervix, bilateral tubal stumps, bilateral fimbriae, uterus with multiple 3-4 cm size fibroids.  DESCRIPTION OF PROCEDURE:  After informed consent was reviewed with the patient, including risks, benefits and alternatives of the surgical procedure, she was transported to the operating room and placed on the table in supine position.  General  anesthesia was induced and found to be adequate.  After an appropriate timeout was performed, she was placed in the [place] stirrups, prepped and draped in the normal sterile fashion.  A Foley catheter was placed with a size down to 12 [place] due to the  size of her ureteral orifice.  Using an open-sided speculum, a Hulka tenaculum was placed on her cervix into her uterus.  Gloves and gown were changed.  Attention was turned to the abdominal portion of the case.  A 5 mm incision was made in her  umbilicus after injecting with Marcaine.  The fascia was cleared off with a hemostat and after passing the hanging drop test, she was insufflated with an opening pressure of 3 mmHg.  The trocar was attempted to be placed under direct visualization.  This  was unable to be placed due to poor visualization.  Therefore, was changed to attempting an open incision.  This was difficult to have the trocar placed due to adhesions of the omentum.   Decision was made for safety to proceed with a left upper quadrant  entry.  The NG tube was placed to suction and the 5 mm port was placed in the middle of the 9th rib.  The trocar was placed.  We were able to visualize the opening at the umbilicus and the adhesions were somewhat released with manual digital dissection.   The 10 mm port was placed at the umbilicus.  An accessory ports were placed on both the right and left under direct visualization.  Some adhesions were lysed from the uterus to the left abdominal wall.  The uterus was noted to have multiple 2-3 cm  fibroids on posteriorly as well as on the left aspect and on the cervix, making the surgery more difficult.  The fimbriated end of tube was identified on the left and removed. The round ligament was difficult to identify on the left.  The tubal stump was  identified on the right and grasped with a million-dollar grasper.  The uterus was elevated and the round ligament and broad ligaments were divided using the Harmonic scalpel to the level of the uterine artery.  A bladder flap was created to the midline.  Using a  single tooth tenaculum,the superior aspect of the uterus was grasped and elevated.  The tubal stump was then identified.  The round ligament was incised using the Harmonic scalpel.  The bladder flap was created as well using the Harmonic scalpel.   The bladder flap was developed slightly more further emphasizing the uterine fibroid on top of her cervix.  The uterine fibroid on the left made dividing the  broad ligament slightly more complicated, but was divided.  Instruments were removed.   Attention was turned to the vaginal portion of the case.  Her cervix was injected with vasopressin 20 in 100.  The cervix was grasped with Acquanetta Sit and circumscribed with Bovie cautery.  The attempt was made to enter anteriorly.  This was  Unsuccessful due to fibroid at cervix.  The posterior cul-de-sac was entered sharply.  A fibroid was  noted to be at the posterior aspect of the uterus.  The uterosacral ligaments were ligated and held on the right and left.  In a stepwise fashion, the pedicles were made to meet  up with the pedicles from above. The uterus was then delivered.  After delivery of the uterus, several areas were made hemostatic.  The right fimbria was also identified and removed with the aid of a clamp.  The uterosacral ligaments were plicated and  held sutures were tied together.  The cuff was then closed with 2-0 Vicryl in a running locked fashion.  A cystoscopy was performed revealing bilateral ureteral jets and a bubble at the superior aspect of the bladder.  The instruments were removed from  the vagina.  Gloves and gown were changed.  Attention was turned to the abdominal portion of the case.  Her belly was again insufflated and hemostasis was assured.  The trocars were removed under direct visualization and the ports were closed.  The  umbilical port had held sutures closed with this, the skin was closed in all four with 4-0 Vicryl and Dermabond.  The patient tolerated the procedure well.  Sponge, lap, and needle count was correct x2 per the operating room staff.   NIK D: 08/29/2022 5:05:42 pm T: 08/29/2022 11:32:00 pm  JOB: [id number]/ [id number]

## 2022-08-30 ENCOUNTER — Encounter (HOSPITAL_BASED_OUTPATIENT_CLINIC_OR_DEPARTMENT_OTHER): Payer: Self-pay | Admitting: Obstetrics and Gynecology

## 2022-08-30 LAB — SURGICAL PATHOLOGY

## 2022-08-30 NOTE — Anesthesia Postprocedure Evaluation (Signed)
Anesthesia Post Note  Patient: Emonnie Corporate treasurer) Performed: LAPAROSCOPIC ASSISTED VAGINAL HYSTERECTOMY WITH SALPINGECTOMY (Bilateral) CYSTOSCOPY LAPAROSCOPIC LYSIS OF ADHESIONS     Patient location during evaluation: PACU Anesthesia Type: General Level of consciousness: awake and alert Pain management: pain level controlled Vital Signs Assessment: post-procedure vital signs reviewed and stable Respiratory status: spontaneous breathing, nonlabored ventilation, respiratory function stable and patient connected to nasal cannula oxygen Cardiovascular status: blood pressure returned to baseline and stable Postop Assessment: no apparent nausea or vomiting Anesthetic complications: no   No notable events documented.  Last Vitals:  Vitals:   08/29/22 1715 08/29/22 2026  BP: 124/75 (!) 141/75  Pulse: 94 75  Resp: 15 16  Temp: 36.5 C (!) 36.4 C  SpO2: 98% 99%    Last Pain:  Vitals:   08/29/22 1715  TempSrc:   PainSc: 0-No pain                 Laura Rice

## 2022-10-04 ENCOUNTER — Ambulatory Visit: Payer: BC Managed Care – PPO | Admitting: Family Medicine

## 2022-10-04 ENCOUNTER — Other Ambulatory Visit (INDEPENDENT_AMBULATORY_CARE_PROVIDER_SITE_OTHER): Payer: BC Managed Care – PPO

## 2022-10-04 ENCOUNTER — Other Ambulatory Visit: Payer: Self-pay

## 2022-10-04 DIAGNOSIS — E559 Vitamin D deficiency, unspecified: Secondary | ICD-10-CM

## 2022-10-04 DIAGNOSIS — E785 Hyperlipidemia, unspecified: Secondary | ICD-10-CM

## 2022-10-04 LAB — LIPID PANEL
Cholesterol: 320 mg/dL — ABNORMAL HIGH (ref 0–200)
HDL: 50.3 mg/dL (ref >39.00)
NonHDL: 269.37
Total CHOL/HDL Ratio: 6
Triglycerides: 277 mg/dL — ABNORMAL HIGH (ref 0.0–149.0)
VLDL: 55.4 mg/dL — ABNORMAL HIGH (ref 0.0–40.0)

## 2022-10-04 LAB — LDL CHOLESTEROL, DIRECT: Direct LDL: 203 mg/dL

## 2022-10-04 LAB — VITAMIN D 25 HYDROXY (VIT D DEFICIENCY, FRACTURES): VITD: 54.06 ng/mL (ref 30.00–100.00)

## 2022-10-14 ENCOUNTER — Encounter: Payer: Self-pay | Admitting: Family Medicine

## 2022-10-16 ENCOUNTER — Encounter: Payer: Self-pay | Admitting: Family Medicine

## 2022-10-16 ENCOUNTER — Ambulatory Visit (INDEPENDENT_AMBULATORY_CARE_PROVIDER_SITE_OTHER): Payer: BC Managed Care – PPO | Admitting: Family Medicine

## 2022-10-16 VITALS — BP 124/88 | HR 111 | Temp 98.7°F | Wt 177.2 lb

## 2022-10-16 DIAGNOSIS — E782 Mixed hyperlipidemia: Secondary | ICD-10-CM

## 2022-10-16 DIAGNOSIS — H6121 Impacted cerumen, right ear: Secondary | ICD-10-CM | POA: Diagnosis not present

## 2022-10-16 MED ORDER — PRAVASTATIN SODIUM 10 MG PO TABS: 10.0000 mg | ORAL_TABLET | Freq: Every day | ORAL | 1 refills | Status: DC

## 2022-10-16 NOTE — Progress Notes (Signed)
Subjective:    Patient ID: Laura Rice, female    DOB: [birth year], 56 y.o.   MRN: [id number]  Chief Complaint  Patient presents with   Follow-up    HPI Patient is a 56 year old female with pmh sig for h/o migraines, h/o asthma, seasonal allergies, preDM, and HLD who was seen today for follow-up on HLD.  Lipid recheck on 10/04/2022 with total cholesterol 320, triglycerides 277 and LDL 203.  Patient not currently on a statin.  Endorses family history of HLD.  States myalgias on pravastatin and dad on Lipitor.  Patient endorses being told cholesterol was slightly elevated when she was in her 27s.  Patient states she received COVID and influenza vaccine on Monday, 10/14/2022.  Patient states she started feeling sick yesterday.  States right ear feels clogged.  Unsure if it is due to wax.  Past Medical History:  Diagnosis Date   Colon polyps    colonoscopy 05/23/16  in [place] fever    Hyperlipidemia    Pt follows w/ PCP Dr. Grier Mitts, MD, [place] 07/17/22.   Migraines    just treats with Tylenol OTC,  maybe one or two month   Peptic ulcer    diagnosed 1989, last issue with peptic ulcer around 2001 per pt on 8/18//23   PONV (postoperative nausea and vomiting)    hx of severe nausea & vomiting after surgery   Prediabetes    07/17/22 Hgb A1C 6.1   Uterine fibroid 2023   Wears glasses     No Known Allergies  ROS General: Denies fever, chills, night sweats, changes in weight, changes in appetite HEENT: Denies headaches, ear pain, changes in vision, rhinorrhea, sore throat +rhinorrhea, congestion after flu and COVID vaccines.  Right ear clogged CV: Denies CP, palpitations, SOB, orthopnea Pulm: Denies SOB, cough, wheezing GI: Denies abdominal pain, nausea, vomiting, diarrhea, constipation GU: Denies dysuria, hematuria, frequency, vaginal discharge Msk: Denies muscle cramps, joint pains Neuro: Denies weakness, numbness, tingling Skin: Denies rashes, bruising Psych: Denies  depression, anxiety, hallucinations     Objective:    Blood pressure 124/88, pulse (!) 111, temperature 98.7 F (37.1 C), temperature source Oral, weight 177 lb 3.2 oz (80.4 kg), SpO2 93 %.  Gen. Pleasant, well-nourished, in no distress, normal affect   HEENT: Ruth/AT, face symmetric, conjunctiva clear, no scleral icterus, PERRLA, EOMI, nares patent without drainage.  Left TM full with small amount of cerumen in canal.  Right canal occluded with cerumen.  Right TM full after irrigation. Lungs: no accessory muscle use Cardiovascular: RRR, no peripheral edema Neuro:  A&Ox3, CN II-XII intact, normal gait Skin:  Warm, no lesions/ rash   Wt Readings from Last 3 Encounters:  10/16/22 177 lb 3.2 oz (80.4 kg)  08/29/22 173 lb 4.8 oz (78.6 kg)  08/27/22 173 lb (78.5 kg)    Lab Results  Component Value Date   WBC 13.8 (H) 08/29/2022   HGB 13.8 08/29/2022   HCT 42.9 08/29/2022   PLT 224 08/29/2022   GLUCOSE 138 (H) 08/29/2022   CHOL 320 (H) 10/04/2022   TRIG 277.0 (H) 10/04/2022   HDL 50.30 10/04/2022   LDLDIRECT 203.0 10/04/2022   ALT 31 08/29/2022   AST 34 08/29/2022   NA 138 08/29/2022   K 4.7 08/29/2022   CL 102 08/29/2022   CREATININE 1.05 (H) 08/29/2022   BUN 15 08/29/2022   CO2 26 08/29/2022   TSH 4.14 07/17/2022   HGBA1C 6.1 07/17/2022    Assessment/Plan:  Mixed  hyperlipidemia -Total cholesterol 320, HDL 50.3, triglycerides 277 on 10/04/2022 -Discussed r/b/a of starting statin. -Rx for pravastatin 10 mg sent to pharmacy.  Consider taking OTC medication to help reduce possible myalgias. -Repeat lipid panel and CMP in 3 months.  Orders placed. - Plan: pravastatin (PRAVACHOL) 10 MG tablet  Impacted cerumen of right ear -Consent obtained.  Right ear irrigated.  Patient tolerated procedure well. -Consider OTC Debrox eardrops -Given handout  Supportive care for symptoms related to COVID and influenza vaccine given on 10/14/2022.  F/u in 3-4 months for lipid  panel  Grier Mitts, MD

## 2022-10-16 NOTE — Patient Instructions (Addendum)
A prescription for pravastatin 10 mg was sent to your pharmacy.  This is the medication for cholesterol.  Some people will take over-the-counter supplement CoQ 10 along with their cholesterol medication to help reduce the risk of muscle and joint pain.   You can consider using over-the-counter Debrox eardrops to help remove wax from your ears.

## 2022-12-19 ENCOUNTER — Encounter: Payer: Self-pay | Admitting: Family Medicine

## 2023-04-07 ENCOUNTER — Encounter: Payer: Self-pay | Admitting: Family Medicine

## 2023-04-07 ENCOUNTER — Other Ambulatory Visit: Payer: Self-pay | Admitting: Family Medicine

## 2023-04-07 ENCOUNTER — Ambulatory Visit: Payer: PRIVATE HEALTH INSURANCE | Admitting: Family Medicine

## 2023-04-07 ENCOUNTER — Other Ambulatory Visit (INDEPENDENT_AMBULATORY_CARE_PROVIDER_SITE_OTHER): Payer: PRIVATE HEALTH INSURANCE

## 2023-04-07 ENCOUNTER — Other Ambulatory Visit: Payer: Self-pay

## 2023-04-07 VITALS — BP 122/92 | HR 76 | Temp 98.5°F | Wt 177.2 lb

## 2023-04-07 DIAGNOSIS — R6889 Other general symptoms and signs: Secondary | ICD-10-CM

## 2023-04-07 DIAGNOSIS — N951 Menopausal and female climacteric states: Secondary | ICD-10-CM

## 2023-04-07 DIAGNOSIS — E782 Mixed hyperlipidemia: Secondary | ICD-10-CM | POA: Diagnosis not present

## 2023-04-07 LAB — LIPID PANEL
Cholesterol: 242 mg/dL — ABNORMAL HIGH (ref 0–200)
HDL: 53.6 mg/dL (ref >39.00)
NonHDL: 188.45
Total CHOL/HDL Ratio: 5
Triglycerides: 310 mg/dL — ABNORMAL HIGH (ref 0.0–149.0)
VLDL: 62 mg/dL — ABNORMAL HIGH (ref 0.0–40.0)

## 2023-04-07 LAB — TSH: TSH: 2.58 u[IU]/mL (ref 0.35–5.50)

## 2023-04-07 LAB — LDL CHOLESTEROL, DIRECT: Direct LDL: 149 mg/dL

## 2023-04-07 NOTE — Patient Instructions (Signed)
Here is a list of some of the area OB/Gyn providers.  It is not an all inclusive list but should help you get started.  [facility] -Jaymes Graff, MD  -Hoover Browns, MD - Osborn Coho, MD -Marline Backbone, MD ---Dierdre Forth, MD is listed on their site, but I'm pretty sure she retired.  [facility] -Ellison Hughs, MD -Pryor Ochoa, DO  [facility] -Gerald Leitz, MD  -Steva Ready, DO  [facility] -Derl Barrow, MD -Marlow Baars, MD  [facility] Shea Evans, MD  [place] [facility] for [facility] Leda Quail, MD ---only does gynecology now, not obstetrics.----Maybe retiring -Yevette Edwards, MD

## 2023-04-07 NOTE — Progress Notes (Signed)
Established Patient Office Visit   Subjective  Patient ID: Raychell Driver [place], female    DOB: 10/20/1966  Age: 57 y.o. MRN: [id number]  Chief Complaint  Patient presents with   Thyroid Problem    Wants to discuss feeling cold all the time. Did have labs done earlier.    Pt is a 57 yo female with pmh sig for menopause, HLD, h/o migraines, h/o asthma, seasonal allergies who was seen for f/u on HLD.  Pt had labs drawn earlier today to recheck cholesterol.  Pt also mentions feeling cold mostly in upper back/shoulders even when having a hot flash.  Does not feel like iron is low as now eating red meat, chicken, [place].  Needs a new OB/Gyn.  States previous provider does not accept pt's new insurance.  Pt had hysterectomy and cystoscopy 08/2022.  Was advised to f/u with OB/Gyn if continued to have vaginal d/c.  Pt endorses continued clear vaginal d/c, no odor or irritation.    Patient Active Problem List   Diagnosis Date Noted   S/P laparoscopic assisted vaginal hysterectomy (LAVH) 08/29/2022   Hyperlipidemia 02/03/2019   Seasonal allergies 02/03/2019   History of migraine 02/03/2019   History of asthma 02/03/2019   Social History   Tobacco Use   Smoking status: Never   Smokeless tobacco: Never  Vaping Use   Vaping Use: Never used  Substance Use Topics   Alcohol use: Yes    Comment: about 2 or 3 glasses of wine or mixed drinks per month   Drug use: Never   Family History  Problem Relation Age of Onset   Alcohol abuse Brother    Diabetes Brother    Early death Brother    Alcohol abuse Daughter    Drug abuse Daughter    Asthma Son    Arthritis Maternal Grandmother    Diabetes Maternal Grandmother    Arthritis Maternal Grandfather    Asthma Paternal Grandmother    Early death Paternal Grandmother    Breast cancer Neg Hx    No Known Allergies    ROS Negative unless stated above    Objective:     BP (!) 122/92 (BP Location: Right Arm, Patient Position: Sitting, Cuff  Size: Normal)   Pulse 76   Temp 98.5 F (36.9 C) (Oral)   Wt 177 lb 3.2 oz (80.4 kg)   SpO2 95%   BMI 31.39 kg/m  BP Readings from Last 3 Encounters:  04/07/23 (!) 122/92  10/16/22 124/88  08/29/22 (!) 141/75   Wt Readings from Last 3 Encounters:  04/07/23 177 lb 3.2 oz (80.4 kg)  10/16/22 177 lb 3.2 oz (80.4 kg)  08/29/22 173 lb 4.8 oz (78.6 kg)      Physical Exam Constitutional:      General: She is not in acute distress.    Appearance: Normal appearance.  HENT:     Head: Normocephalic and atraumatic.     Nose: Nose normal.     Mouth/Throat:     Mouth: Mucous membranes are moist.  Eyes:     Extraocular Movements: Extraocular movements intact.     Conjunctiva/sclera: Conjunctivae normal.     Pupils: Pupils are equal, round, and reactive to light.  Cardiovascular:     Rate and Rhythm: Normal rate.     Heart sounds: No murmur heard.    No gallop.  Pulmonary:     Effort: Pulmonary effort is normal. No respiratory distress.     Breath sounds: No wheezing, rhonchi  or rales.  Skin:    General: Skin is warm and dry.  Neurological:     Mental Status: She is alert and oriented to person, place, and time.      Assessment & Plan:  Cold intolerance  Hot flashes due to menopause  Mixed hyperlipidemia  Labs drawn earlier today to assess thyroid and cholesterol.  Patient given information on area OB/GYN providers to set up follow-up/establish care.  Continue pravastatin 10 mg daily.  If needed will increase dose based on results.  Return if symptoms worsen or fail to improve.   Deeann Saint, MD

## 2023-06-03 ENCOUNTER — Encounter: Payer: Self-pay | Admitting: Family Medicine

## 2023-06-03 DIAGNOSIS — E782 Mixed hyperlipidemia: Secondary | ICD-10-CM

## 2023-06-04 MED ORDER — PRAVASTATIN SODIUM 20 MG PO TABS: 20.0000 mg | ORAL_TABLET | Freq: Every day | ORAL | 3 refills | Status: DC

## 2023-07-28 ENCOUNTER — Other Ambulatory Visit: Payer: Self-pay | Admitting: Family Medicine

## 2023-07-28 DIAGNOSIS — Z1231 Encounter for screening mammogram for malignant neoplasm of breast: Secondary | ICD-10-CM

## 2023-08-26 ENCOUNTER — Ambulatory Visit
Admission: RE | Admit: 2023-08-26 | Discharge: 2023-08-26 | Disposition: A | Discharge status: Home / Self Care | Payer: PRIVATE HEALTH INSURANCE | Source: Ambulatory Visit

## 2023-08-26 DIAGNOSIS — Z1231 Encounter for screening mammogram for malignant neoplasm of breast: Secondary | ICD-10-CM

## 2023-08-28 ENCOUNTER — Other Ambulatory Visit: Payer: Self-pay | Admitting: Family Medicine

## 2023-08-28 DIAGNOSIS — R928 Other abnormal and inconclusive findings on diagnostic imaging of breast: Secondary | ICD-10-CM

## 2023-09-01 ENCOUNTER — Ambulatory Visit
Admission: RE | Admit: 2023-09-01 | Discharge: 2023-09-01 | Disposition: A | Discharge status: Home / Self Care | Payer: PRIVATE HEALTH INSURANCE | Source: Ambulatory Visit | Attending: Family Medicine

## 2023-09-01 ENCOUNTER — Ambulatory Visit: Payer: PRIVATE HEALTH INSURANCE

## 2023-09-01 DIAGNOSIS — R928 Other abnormal and inconclusive findings on diagnostic imaging of breast: Secondary | ICD-10-CM

## 2023-09-02 ENCOUNTER — Other Ambulatory Visit: Payer: Self-pay | Admitting: Family Medicine

## 2023-12-04 ENCOUNTER — Ambulatory Visit: Payer: PRIVATE HEALTH INSURANCE | Admitting: Family Medicine

## 2023-12-04 VITALS — BP 122/88 | HR 76 | Temp 98.5°F | Ht 63.0 in | Wt 184.2 lb

## 2023-12-04 DIAGNOSIS — D1722 Benign lipomatous neoplasm of skin and subcutaneous tissue of left arm: Secondary | ICD-10-CM | POA: Diagnosis not present

## 2023-12-04 DIAGNOSIS — D170 Benign lipomatous neoplasm of skin and subcutaneous tissue of head, face and neck: Secondary | ICD-10-CM

## 2023-12-04 DIAGNOSIS — R0683 Snoring: Secondary | ICD-10-CM | POA: Diagnosis not present

## 2023-12-04 DIAGNOSIS — R5383 Other fatigue: Secondary | ICD-10-CM | POA: Diagnosis not present

## 2023-12-04 NOTE — Progress Notes (Addendum)
Established Patient Office Visit   Subjective  Patient ID: Laura Rice, female    DOB: [birth year]  Age: 57 y.o. MRN: [id number]  Chief Complaint  Patient presents with   soft tissue mass     Left shoulder soft tissue mass, follow-up, patient denies any pain,     Patient is a 57 year old female seen for ongoing concern.  Pt w/ h/o mass in base of neck and lipoma of left shoulder both previously removed in [place].  Patient states the right lobe of her thyroid was also removed due to the mass.  Patient and her family members feel like the areas have returned and are increasing in size.  If laying in bed with neck flexed she feels like it is difficult to swallow/breathe.  Interested in removal.  Patient has follow-up appointment with OB/GYN 12/22/2023.  States hot flashes have improved some since increasing estradiol patch from 0.5-0.1.  Still waking up several times per night.  Patient in bed by 9 or 930 and having difficulty getting up around 6 AM.  Typically gets back in bed until 7-ish.  Patient does not have the energy to exercise.  Endorses being told she snores at night.  Has never had a sleep study.    Patient Active Problem List   Diagnosis Date Noted   S/P laparoscopic assisted vaginal hysterectomy (LAVH) 08/29/2022   Fibroids 02/22/2022   Hyperlipidemia 02/03/2019   Seasonal allergies 02/03/2019   History of migraine 02/03/2019   History of asthma 02/03/2019   Menopause 03/10/2013   Past Medical History:  Diagnosis Date   Colon polyps    colonoscopy 05/23/16  in [place] fever    Hyperlipidemia    Pt follows w/ PCP Dr. Abbe [place], MD, [place] 07/17/22.   Migraines    just treats with Tylenol OTC,  maybe one or two month   Peptic ulcer    diagnosed 1989, last issue with peptic ulcer around 2001 per pt on 8/18//23   PONV (postoperative nausea and vomiting)    hx of severe nausea & vomiting after surgery   Prediabetes    07/17/22 Hgb A1C 6.1   Uterine fibroid  2023   Wears glasses    Past Surgical History:  Procedure Laterality Date   CYSTOSCOPY N/A 08/29/2022   Procedure: CYSTOSCOPY;  Surgeon: Sherian Rein, MD;  Location: [facility];  Service: Gynecology;  Laterality: N/A;   LAPAROSCOPIC LYSIS OF ADHESIONS  08/29/2022   Procedure: LAPAROSCOPIC LYSIS OF ADHESIONS;  Surgeon: Sherian Rein, MD;  Location: [facility];  Service: Gynecology;;   LAPAROSCOPIC VAGINAL HYSTERECTOMY WITH SALPINGECTOMY Bilateral 08/29/2022   Procedure: LAPAROSCOPIC ASSISTED VAGINAL HYSTERECTOMY WITH SALPINGECTOMY;  Surgeon: Sherian Rein, MD;  Location: [facility];  Service: Gynecology;  Laterality: Bilateral;   NOVASURE ABLATION  2008   THYROIDECTOMY, PARTIAL  2010   TUBAL LIGATION  1993   Social History   Tobacco Use   Smoking status: Never   Smokeless tobacco: Never  Vaping Use   Vaping status: Never Used  Substance Use Topics   Alcohol use: Yes    Comment: about 2 or 3 glasses of wine or mixed drinks per month   Drug use: Never   Family History  Problem Relation Age of Onset   Alcohol abuse Brother    Diabetes Brother    Early death Brother    Alcohol abuse Daughter    Drug abuse Daughter    Asthma Son  Arthritis Maternal Grandmother    Diabetes Maternal Grandmother    Arthritis Maternal Grandfather    Asthma Paternal Grandmother    Early death Paternal Grandmother    Breast cancer Neg Hx    No Known Allergies    ROS Negative unless stated above    Objective:     BP 122/88 (BP Location: Left Arm, Patient Position: Sitting, Cuff Size: Large)   Pulse 76   Temp 98.5 F (36.9 C) (Oral)   Ht 5\' 3"  (1.6 m)   Wt 184 lb 3.2 oz (83.6 kg)   SpO2 95%   BMI 32.63 kg/m  BP Readings from Last 3 Encounters:  12/04/23 122/88  04/07/23 (!) 122/92  10/16/22 124/88   Wt Readings from Last 3 Encounters:  12/04/23 184 lb 3.2 oz (83.6 kg)  04/07/23 177 lb 3.2 oz (80.4 kg)  10/16/22  177 lb 3.2 oz (80.4 kg)      Physical Exam Constitutional:      Appearance: Normal appearance.  HENT:     Head: Normocephalic and atraumatic.     Nose: Nose normal.     Mouth/Throat:     Mouth: Mucous membranes are moist.  Eyes:     Extraocular Movements: Extraocular movements intact.     Conjunctiva/sclera: Conjunctivae normal.  Neck:     Trachea: Trachea normal.      Comments: A 2 cm mobile, smooth, slightly firm mass of left shoulder superior to left mid clavicle.  No erythema, tenderness, skin changes noted.  Well-healed hypopigmented surgical incision superior to mass.  Well-healed surgical incision at base of neck.  Subtle fullness in right base of neck.   Musculoskeletal:     Cervical back: Normal range of motion.  Lymphadenopathy:     Cervical: No cervical adenopathy.  Neurological:     Mental Status: She is alert.     No results found for any visits on 12/04/23.    Assessment & Plan:  Lipoma of neck -Concern for recurrence of lipoma versus other mass s/p partial right lobe thyroidectomy for similar findings.  [place] -Patient wishes to proceed with removal -     Ambulatory referral to General Surgery -     CT SOFT TISSUE NECK W CONTRAST; Future  Lipoma of left shoulder -     Ambulatory referral to General Surgery  Snoring -     Ambulatory referral to Sleep Studies  Fatigue, unspecified type -Discussed possible causes of fatigue including menopause, OSA, thyroid or electrolyte dysfunction. -Continue follow-up with OB/GYN -Will obtain sleep study. -     Ambulatory referral to Sleep Studies   Return if symptoms worsen or fail to improve.   Deeann Saint, MD

## 2023-12-23 ENCOUNTER — Ambulatory Visit
Admission: RE | Admit: 2023-12-23 | Discharge: 2023-12-23 | Disposition: A | Discharge status: Home / Self Care | Payer: PRIVATE HEALTH INSURANCE | Source: Ambulatory Visit | Attending: Family Medicine | Admitting: Family Medicine

## 2023-12-23 DIAGNOSIS — D170 Benign lipomatous neoplasm of skin and subcutaneous tissue of head, face and neck: Secondary | ICD-10-CM

## 2023-12-23 MED ORDER — IOPAMIDOL (ISOVUE-300) INJECTION 61%
75.0000 mL | Freq: Once | INTRAVENOUS | Status: AC | PRN
  Administered 2023-12-23: 75 mL via INTRAVENOUS

## 2023-12-29 ENCOUNTER — Other Ambulatory Visit: Payer: Self-pay | Admitting: General Surgery

## 2023-12-29 DIAGNOSIS — R221 Localized swelling, mass and lump, neck: Secondary | ICD-10-CM

## 2023-12-30 ENCOUNTER — Ambulatory Visit
Admission: RE | Admit: 2023-12-30 | Discharge: 2023-12-30 | Disposition: A | Discharge status: Home / Self Care | Payer: PRIVATE HEALTH INSURANCE | Source: Ambulatory Visit | Attending: General Surgery | Admitting: General Surgery

## 2023-12-30 DIAGNOSIS — R221 Localized swelling, mass and lump, neck: Secondary | ICD-10-CM

## 2024-01-15 ENCOUNTER — Ambulatory Visit: Payer: PRIVATE HEALTH INSURANCE | Admitting: Neurology

## 2024-01-15 ENCOUNTER — Encounter: Payer: Self-pay | Admitting: Neurology

## 2024-01-15 VITALS — BP 134/74 | HR 78 | Ht 63.0 in | Wt 184.0 lb

## 2024-01-15 DIAGNOSIS — N951 Menopausal and female climacteric states: Secondary | ICD-10-CM

## 2024-01-15 DIAGNOSIS — E66812 Obesity, class 2: Secondary | ICD-10-CM | POA: Diagnosis not present

## 2024-01-15 DIAGNOSIS — R0683 Snoring: Secondary | ICD-10-CM

## 2024-01-15 DIAGNOSIS — E661 Drug-induced obesity: Secondary | ICD-10-CM

## 2024-01-15 DIAGNOSIS — R0681 Apnea, not elsewhere classified: Secondary | ICD-10-CM | POA: Diagnosis not present

## 2024-01-15 DIAGNOSIS — Z6835 Body mass index (BMI) 35.0-35.9, adult: Secondary | ICD-10-CM

## 2024-01-15 NOTE — Progress Notes (Signed)
SLEEP MEDICINE CLINIC    Provider:  Melvyn Novas, MD  Primary Care Physician:  Deeann Saint, MD [address] [place] [address]     Referring Provider: Deeann Saint, Md [address] [place],  [place] [address]          Chief Complaint according to patient   Patient presents with:     New Sleep Patient (Initial Visit)           HISTORY OF PRESENT ILLNESS:  Laura Rice is a 58 y.o. female patient  of dr Salomon Fick; who is seen upon referral on 01/15/2024 for an evaluation of snoring and fatigue. She has menopausal hot flushes, while on HRT. September 2023 she underwent hysterectomy and her PCP has recently increased to twice a week hormone patches. Estradiol patches are a combi of  1.0 and 0.5 mg. Her pillow is sweat drenched in AM.  Her spouse noted snoring, crescendo snoring , and pauses in nocturnal breathing.   Chief concern according to patient :  see above , more fatigued, non restorative sleep, even after 8 hours of sleep.  I have the pleasure of seeing Laura Rice on [birth year] a pleasant AA right -handed female with a possible sleep disorder.      Sleep relevant medical history: Nocturia 0-1 time, partial thyroidectomy,  No Tonsillectomy, menopause, hysterectomy because of fibroids. Amenorrhea since 2008 after ablation.  Left hip pain.    Family medical /sleep history: no  other family member with OSA   Social history:  Patient is working as Production designer, theatre/television/film,  IT sales professional,  and lives in a household with 2 adult children and husband.  The patient currently works daytime, office bound,  2 cats.  Tobacco use: none .   ETOH use : social ,  Caffeine intake in form of Coffee( 1-2 cups in AM ) Soda( /) Tea ( /) or energy drinks. Exercise: no routine, feeling too fatigued.   Sleep habits are as follows: The patient's dinner time is between 6.30 PM. The patient goes to bed at 9.30 PM and  asleep promptly, continues to sleep for  at least 7  hours, wakes for 0-1 bathroom breaks, the first time at 2 AM.   Bedroom is cool, quiet and dark. Fan directed to her face, she wears a sleep mask.  Husband goes to bed later, is restless.  The preferred sleep position is lateral , right , with the support of 2 pillows. Flat bed.   Dreams are reportedly frequent.. The patient wakes up spontaneously before her  alarm rings  at 6 AM,  6  AM is the usual rise time. She feeds the cats, goes back to bed for another hour.  She reports not feeling refreshed or restored in AM, with symptoms such as dry mouth, sweating, and residual fatigue.  Naps are taken infrequently.  Review of Systems: Out of a complete 14 system review, the patient complains of only the following symptoms, and all other reviewed systems are negative.:  Fatigue, sleepiness , snoring, hot flushes, and    How likely are you to doze in the following situations: 0 = not likely, 1 = slight chance, 2 = moderate chance, 3 = high chance   Sitting and Reading? Watching Television? Sitting inactive in a public place (theater or meeting)? As a passenger in a car for an hour without a break? Lying down in the afternoon when circumstances permit? Sitting and talking to  someone? Sitting quietly after lunch without alcohol? In a car, while stopped for a few minutes in traffic?   Total = 2/ 24 points   FSS endorsed at 41/ 63 points.     Social History   Socioeconomic History   Marital status: Married    Spouse name: Not on file   Number of children: Not on file   Years of education: Not on file   Highest education level: Master's degree (e.g., MA, MS, MEng, MEd, MSW, MBA)  Occupational History   Not on file  Tobacco Use   Smoking status: Never   Smokeless tobacco: Never  Vaping Use   Vaping status: Never Used  Substance and Sexual Activity   Alcohol use: Yes    Comment: about 2 or 3 glasses of wine or mixed drinks per month   Drug use: Never   Sexual activity:  Yes    Birth control/protection: Post-menopausal  Other Topics Concern   Not on file  Social History Narrative   Pt lives with husband    Pt works    Social Drivers of Corporate investment banker Strain: Low Risk  (12/04/2023)   Overall Financial Resource Strain (CARDIA)    Difficulty of Paying Living Expenses: Not hard at all  Food Insecurity: No Food Insecurity (12/04/2023)   Hunger Vital Sign    Worried About Running Out of Food in the Last Year: Never true    Ran Out of Food in the Last Year: Never true  Transportation Needs: No Transportation Needs (12/04/2023)   PRAPARE - Administrator, Civil Service (Medical): No    Lack of Transportation (Non-Medical): No  Physical Activity: Unknown (12/04/2023)   Exercise Vital Sign    Days of Exercise per Week: 0 days    Minutes of Exercise per Session: Not on file  Stress: No Stress Concern Present (12/04/2023)   [employer] of Occupational Health - Occupational Stress Questionnaire    Feeling of Stress : Not at all  Social Connections: Moderately Isolated (12/04/2023)   Social Connection and Isolation Panel [NHANES]    Frequency of Communication with Friends and Family: More than three times a week    Frequency of Social Gatherings with Friends and Family: Once a week    Attends Religious Services: Never    Database administrator or Organizations: No    Attends Engineer, structural: Not on file    Marital Status: Married    Family History  Problem Relation Age of Onset   Alcohol abuse Brother    Diabetes Brother    Early death Brother    Arthritis Maternal Grandmother    Diabetes Maternal Grandmother    Arthritis Maternal Grandfather    Asthma Paternal Grandmother    Early death Paternal Grandmother    Alcohol abuse Daughter    Drug abuse Daughter    Asthma Son    Breast cancer Neg Hx    Sleep apnea Neg Hx     Past Medical History:  Diagnosis Date   Colon polyps    colonoscopy 05/23/16  in  [place]    Hyperlipidemia    Pt follows w/ PCP Dr. Abbe Amsterdam, MD, Theron Arista 07/17/22.   Migraines    just treats with Tylenol OTC,  maybe one or two month   Peptic ulcer    diagnosed 1989, last issue with peptic ulcer around 2001 per pt on 8/18//23   PONV (postoperative nausea and vomiting)  hx of severe nausea & vomiting after surgery   Prediabetes    07/17/22 Hgb A1C 6.1   Uterine fibroid 2023   Wears glasses     Past Surgical History:  Procedure Laterality Date   CYSTOSCOPY N/A 08/29/2022   Procedure: CYSTOSCOPY;  Surgeon: Sherian Rein, MD;  Location: [facility];  Service: Gynecology;  Laterality: N/A;   LAPAROSCOPIC LYSIS OF ADHESIONS  08/29/2022   Procedure: LAPAROSCOPIC LYSIS OF ADHESIONS;  Surgeon: Sherian Rein, MD;  Location: [facility];  Service: Gynecology;;   LAPAROSCOPIC VAGINAL HYSTERECTOMY WITH SALPINGECTOMY Bilateral 08/29/2022   Procedure: LAPAROSCOPIC ASSISTED VAGINAL HYSTERECTOMY WITH SALPINGECTOMY;  Surgeon: Sherian Rein, MD;  Location:  SURGERY CENTER;  Service: Gynecology;  Laterality: Bilateral;   NOVASURE ABLATION  2008   THYROIDECTOMY, PARTIAL  2010   TUBAL LIGATION  1993     Current Outpatient Medications on File Prior to Visit  Medication Sig Dispense Refill   Ascorbic Acid (VITAMIN C) 1000 MG tablet Take 1,000 mg by mouth daily.     Cholecalciferol (VITAMIN D-3) 125 MCG (5000 UT) TABS Take 125 mcg by mouth daily.     Coenzyme Q10 (CO Q-10) 100 MG CAPS Take 100 mg by mouth daily.     cyanocobalamin (VITAMIN B12) 1000 MCG tablet Take 1,000 mcg by mouth daily.     estradiol (VIVELLE-DOT) 0.1 MG/24HR patch Place 1 patch onto the skin 2 (two) times a week.     Multiple Vitamins-Minerals (MULTIVITAMIN ADULTS 50+ PO) Take by mouth daily. Gummy     pravastatin (PRAVACHOL) 20 MG tablet Take 1 tablet (20 mg total) by mouth daily. 90 tablet 3   No current facility-administered  medications on file prior to visit.    No Known Allergies   DIAGNOSTIC DATA (LABS, IMAGING, TESTING) - I reviewed patient records, labs, notes, testing and imaging myself where available.  Lab Results  Component Value Date   WBC 13.8 (H) 08/29/2022   HGB 13.8 08/29/2022   HCT 42.9 08/29/2022   MCV 88.6 08/29/2022   PLT 224 08/29/2022      Component Value Date/Time   NA 138 08/29/2022 1556   K 4.7 08/29/2022 1556   CL 102 08/29/2022 1556   CO2 26 08/29/2022 1556   GLUCOSE 138 (H) 08/29/2022 1556   BUN 15 08/29/2022 1556   CREATININE 1.05 (H) 08/29/2022 1556   CALCIUM 8.8 (L) 08/29/2022 1556   PROT 7.8 08/29/2022 0636   ALBUMIN 4.3 08/29/2022 0636   AST 34 08/29/2022 0636   ALT 31 08/29/2022 0636   ALKPHOS 50 08/29/2022 0636   BILITOT 1.0 08/29/2022 0636   GFRNONAA >60 08/29/2022 1556   GFRAA >60 06/05/2019 1608   Lab Results  Component Value Date   CHOL 242 (H) 04/07/2023   HDL 53.60 04/07/2023   LDLDIRECT 149.0 04/07/2023   TRIG 310.0 (H) 04/07/2023   CHOLHDL 5 04/07/2023   Lab Results  Component Value Date   HGBA1C 6.1 07/17/2022   Lab Results  Component Value Date   VITAMINB12 691 03/01/2019   Lab Results  Component Value Date   TSH 2.58 04/07/2023    PHYSICAL EXAM:  Today's Vitals   01/15/24 0913  BP: 134/74  Pulse: 78  Weight: 184 lb (83.5 kg)  Height: 5\' 3"  (1.6 m)   Body mass index is 32.59 kg/m.   Wt Readings from Last 3 Encounters:  01/15/24 184 lb (83.5 kg)  12/04/23 184 lb 3.2 oz (83.6 kg)  04/07/23 177  lb 3.2 oz (80.4 kg)     Ht Readings from Last 3 Encounters:  01/15/24 5\' 3"  (1.6 m)  12/04/23 5\' 3"  (1.6 m)  08/29/22 5\' 3"  (1.6 m)      General: The patient is awake, alert and appears not in acute distress. The patient is well groomed. Head: Normocephalic, atraumatic.  Neck is supple. Mallampati 2,  neck circumference:14. 75  inches . Nasal airflow is not fully patent.  Retrognathia is not seen. Underbite, had wisdom teeth  removed.  Dental status: not crowded , biological  Cardiovascular:  Regular rate and cardiac rhythm by pulse,  without distended neck veins. Respiratory: Lungs are clear to auscultation.  Skin:  With evidence of ankle edema in the right food. Trunk: The patient's posture is erect.   NEUROLOGIC EXAM: The patient is awake and alert, oriented to place and time.   Memory subjective described as intact.  Attention span & concentration ability appears normal.  Speech is fluent,  without dysarthria, dysphonia or aphasia.  Mood and affect are appropriate.   Cranial nerves: no loss of smell or taste reported  Pupils are equal and briskly reactive to light. Funduscopic exam deferred.  Extraocular movements in vertical and horizontal planes were intact and without nystagmus. No Diplopia. Visual fields by finger perimetry are intact. Hearing was intact to soft voice.    Facial sensation intact to fine touch.  Facial motor strength is symmetric and tongue and uvula move midline.  Neck ROM : rotation, tilt and flexion extension were normal for age and shoulder shrug was symmetrical.    Motor exam:  Symmetric bulk, tone and ROM.   Normal tone without cog- wheeling, asymmetric grip strength . Left hand is weaker.  Left thenar eminence is atrophied.    Sensory:  Fine touch and vibration were intact.  Proprioception tested in the upper extremities was normal.   Coordination: Rapid alternating movements in the fingers/hands were of normal speed. The Finger-to-nose maneuver was intact without evidence of ataxia, dysmetria or tremor.   Gait and station: Patient could rise unassisted from a seated position, walked without assistive device.  Stance is of normal width/ base and the patient turned with 3 steps (observation) .  Toe and heel walk were deferred.  Deep tendon reflexes: in the  upper and lower extremities are symmetric and intact.    ASSESSMENT AND PLAN 58 y.o. year old female In menopause  here with:    1) menopausal sleep disturbance - may improve with added progesterone to HRT.  Also typical phenomenon of chest and neck diaphoresis.   2) snoring - needs screening for sleep apnea.  She has witnessed snoring and apnea. HST ordered.   3) weight loss and exercises. Core strength building   Sleep hygiene is excellent.    I plan to follow up either personally or through our NP within 3-5 months.   I would like to thank  Deeann Saint, Md [address] [place],  [place] [address] for allowing me to meet with and to take care of this pleasant patient.   CC: I will share my notes with PCP.  After spending a total time of  45  minutes face to face and additional time for physical and neurologic examination, review of laboratory studies,  personal review of imaging studies, reports and results of other testing and review of referral information / records as far as provided in visit,   Electronically signed by: Melvyn Novas, MD 01/15/2024 9:28 AM  [facility] and [employer] certified by [employer] of Sleep Medicine and Diplomate of the [employer] of Sleep Medicine. Board certified In Neurology through the ABPN, Fellow of the [employer] of Neurology.

## 2024-01-15 NOTE — Addendum Note (Signed)
Addended by: Melvyn Novas on: 01/15/2024 10:13 AM   Modules accepted: Orders

## 2024-01-15 NOTE — Patient Instructions (Signed)
Sleep Apnea Test: What to Expect  Sleep apnea is a condition that affects your breathing while you're sleeping. You may have shallow breathing or stop breathing for short periods of time. Sleep apnea screening is a test to check if you're at risk for sleep apnea. The test includes questions. It will only takes a few minutes. Your health care provider may ask you to have this test before a surgery or as part of a physical exam. What are the symptoms of sleep apnea? Snoring. Waking up often at night. Daytime sleepiness. Pauses in breathing. Choking or gasping during sleep. Being annoyed easily. Forgetfulness. Trouble thinking clearly. Depression. Personality changes. Headaches in the morning. Most people with sleep apnea do not know that they have it. What are the advantages of sleep apnea screening? Getting screened for sleep apnea can help: Keep you safer. Your providers need to know whether or not you have sleep apnea, especially if you're having surgery or have other long-term, or chronic, health conditions. Improve your health and help you get a better night's rest. Restful sleep can help you: Have more energy. Lose weight. Improve high blood pressure. Improve diabetes management. Prevent stroke. Prevent car accidents. What happens before the screening? You may talk with your provider about the screening and what other tests may be recommended based on the screening. What happens during the screening? Screening usually includes being asked a list of questions about your sleep quality. Some questions you may be asked include: Do you snore? Is your sleep restless? Do you have daytime sleepiness? Has a partner or spouse told you that you stop breathing, choke, or gasp during sleep? Have you had trouble concentrating or memory loss? What is your age? What is your neck circumference? To measure your neck, keep your back straight and gently wrap the tape measure around your neck.  Put the tape measure at the middle of your neck, between your chin and collarbone. What is your sex assigned at birth? Do you have high blood pressure or are you being treated for high blood pressure? If your screening test is positive, you're at risk for the condition. More tests may be needed to confirm a diagnosis of sleep apnea. What can I expect after the screening? Your provider will go over the results of the screening with you and make recommendations based on the results of the test. Where to find more information You can find screening tools online or at your health care clinic. To learn more, go to these websites: Centers for Disease Control and Prevention: DiningCalendar.de. Then: Click Health Topics A-Z. Type "sleep apnea" in the search box. National Heart, Lung, and Blood Institute: BuffaloDryCleaner.gl Contact a health care provider if: You think that you may have sleep apnea. This information is not intended to replace advice given to you by your health care provider. Make sure you discuss any questions you have with your health care provider. Document Revised: 05/17/2023 Document Reviewed: 05/17/2023 Elsevier Patient Education  2024 [employer].

## 2024-01-22 ENCOUNTER — Ambulatory Visit: Payer: PRIVATE HEALTH INSURANCE | Admitting: Family Medicine

## 2024-01-22 ENCOUNTER — Encounter: Payer: Self-pay | Admitting: Family Medicine

## 2024-01-22 VITALS — BP 122/70 | HR 84 | Temp 98.4°F | Ht 63.0 in | Wt 184.4 lb

## 2024-01-22 DIAGNOSIS — E890 Postprocedural hypothyroidism: Secondary | ICD-10-CM | POA: Diagnosis not present

## 2024-01-22 DIAGNOSIS — D170 Benign lipomatous neoplasm of skin and subcutaneous tissue of head, face and neck: Secondary | ICD-10-CM | POA: Diagnosis not present

## 2024-01-22 NOTE — Progress Notes (Signed)
Established Patient Office Visit   Subjective  Patient ID: Laura Rice [place], female    DOB: 15-Apr-1966  Age: 58 y.o. MRN: [id number]  Chief Complaint  Patient presents with   Lipoma    Patient was seen by gen surgery, had a CT and US done, Surgery wants patient to workout thyroid issue first before moving forward    Thyroid Problem    Patient is a 58 year old female seen for follow-up.  Patient with history of partial thyroidectomy due to a lipoma of her neck several years ago.  Patient has not required thyroid medication. Seen 12/04/23  due to concern for possible recurrence of the lipoma.  CT soft tissue neck obtained and referral to general surgery was placed.  Patient states a thyroid ultrasound was ordered after Gen surg appt with Dr. Dwain Sarna.  Patient states she was advised to have an additional testing/scan before surgery to be scheduled, but unclear on why.  Patient denies palpitations, muscle fasciculations, changes in bowels, heat or cold intolerance, or any other symptoms.     Thyroid ultrasound 12/30/2023 without significant abnormality of left thyroidectomy bed.  0.8 cm solid hypoechoic nodule either located within or adjacent to the aspect of the right thyroid lobe.  As a thyroid nodule not meet criteria for FNA or image surveillance.  Alternatively it could be a parathyroid adenoma given its posterior location.  Please correlate with patient's symptoms and laboratory workup for hyperparathyroidism.  Nuclear medicine parathyroid scan may be obtained if clinically appropriate.    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 Abbe Amsterdam, MD, Theron Arista 07/17/22.   Migraines    just treats with Tylenol OTC,  maybe one or two month   Peptic ulcer    diagnosed 1989, last issue with peptic ulcer around 2001 per pt on 8/18//23   PONV (postoperative nausea and vomiting)    hx of severe nausea & vomiting after surgery   Prediabetes    07/17/22 Hgb A1C 6.1   Uterine fibroid 2023   Wears glasses    Past Surgical History:  Procedure Laterality Date   CYSTOSCOPY N/A 08/29/2022   Procedure: CYSTOSCOPY;  Surgeon: Sherian Rein, MD;  Location: [facility];  Service: Gynecology;  Laterality: N/A;   LAPAROSCOPIC LYSIS OF ADHESIONS  08/29/2022   Procedure: LAPAROSCOPIC LYSIS OF ADHESIONS;  Surgeon: Sherian Rein, MD;  Location: [facility];  Service: Gynecology;;   LAPAROSCOPIC VAGINAL HYSTERECTOMY WITH SALPINGECTOMY Bilateral 08/29/2022   Procedure: LAPAROSCOPIC ASSISTED VAGINAL HYSTERECTOMY WITH SALPINGECTOMY;  Surgeon: Sherian Rein, MD;  Location: [facility];  Service: Gynecology;  Laterality: Bilateral;   NOVASURE ABLATION  2008   THYROIDECTOMY, PARTIAL  2010   TUBAL LIGATION  1993   Social History   Tobacco Use   Smoking status: Never   Smokeless tobacco: Never  Vaping Use   Vaping status: Never Used  Substance Use Topics   Alcohol use: Yes    Comment: about  2 or 3 glasses of wine or mixed drinks per month   Drug use: Never   Family History  Problem Relation Age of Onset   Alcohol abuse Brother    Diabetes Brother    Early death Brother    Arthritis Maternal Grandmother    Diabetes Maternal Grandmother    Arthritis Maternal Grandfather    Asthma Paternal Grandmother    Early death Paternal Grandmother    Alcohol abuse Daughter    Drug abuse Daughter    Asthma Son    Breast cancer Neg Hx    Sleep  apnea Neg Hx    No Known Allergies    ROS Negative unless stated above    Objective:     BP 122/70 (BP Location: Left Arm, Patient Position: Sitting, Cuff Size: Large)   Pulse 84   Temp 98.4 F (36.9 C) (Oral)   Ht 5\' 3"  (1.6 m)   Wt 184 lb 6.4 oz (83.6 kg)   SpO2 97%   BMI 32.66 kg/m  BP Readings from Last 3 Encounters:  01/22/24 122/70  01/15/24 134/74  12/04/23 122/88   Wt Readings from Last 3 Encounters:  01/22/24 184 lb 6.4 oz (83.6 kg)  01/15/24 184 lb (83.5 kg)  12/04/23 184 lb 3.2 oz (83.6 kg)      Physical Exam Constitutional:      General: She is not in acute distress.    Appearance: Normal appearance.  HENT:     Head: Normocephalic and atraumatic.     Nose: Nose normal.     Mouth/Throat:     Mouth: Mucous membranes are moist.  Neck:     Comments: Subtle fullness in base of right neck. Cardiovascular:     Rate and Rhythm: Normal rate and regular rhythm.     Heart sounds: Normal heart sounds. No murmur heard.    No gallop.  Pulmonary:     Effort: Pulmonary effort is normal. No respiratory distress.     Breath sounds: Normal breath sounds. No wheezing, rhonchi or rales.  Skin:    General: Skin is warm and dry.  Neurological:     Mental Status: She is alert and oriented to person, place, and time.      No results found for any visits on 01/22/24.    Assessment & Plan:  Lipoma of neck  H/O partial thyroidectomy -     Comprehensive metabolic panel -     TSH -     T4, free -     Parathyroid hormone, intact (no Ca)  Lipoma of neck stable.  Patient hoping to have removed with general surgery.  Appears recent ultrasound with possibility of parathyroid adenoma prompting delay.  Will obtain labs including TSH, free T4, CMP, PTH to evaluate for any parathyroid or thyroid dysfunction.  Can forward testing to Dr. Dwain Sarna, gen surgery.  If normal, would not see an indication to proceed with nuclear medicine scan.  Return if symptoms worsen or fail  to improve.   Deeann Saint, MD

## 2024-01-23 ENCOUNTER — Encounter: Payer: Self-pay | Admitting: Family Medicine

## 2024-01-23 LAB — COMPREHENSIVE METABOLIC PANEL
ALT: 17 U/L (ref 0–35)
AST: 24 U/L (ref 0–37)
Albumin: 4.5 g/dL (ref 3.5–5.2)
Alkaline Phosphatase: 52 U/L (ref 39–117)
BUN: 15 mg/dL (ref 6–23)
CO2: 28 meq/L (ref 19–32)
Calcium: 9.3 mg/dL (ref 8.4–10.5)
Chloride: 99 meq/L (ref 96–112)
Creatinine, Ser: 1.07 mg/dL (ref 0.40–1.20)
GFR: 57.58 mL/min — ABNORMAL LOW (ref >60.00)
Glucose, Bld: 82 mg/dL (ref 70–99)
Potassium: 3.8 meq/L (ref 3.5–5.1)
Sodium: 138 meq/L (ref 135–145)
Total Bilirubin: 0.8 mg/dL (ref 0.2–1.2)
Total Protein: 7.1 g/dL (ref 6.0–8.3)

## 2024-01-23 LAB — PARATHYROID HORMONE, INTACT (NO CA): PTH: 18 pg/mL (ref 16–77)

## 2024-01-23 LAB — TSH: TSH: 3.58 u[IU]/mL (ref 0.35–5.50)

## 2024-01-23 LAB — T4, FREE: Free T4: 0.77 ng/dL (ref 0.60–1.60)

## 2024-02-20 ENCOUNTER — Ambulatory Visit: Payer: PRIVATE HEALTH INSURANCE | Admitting: Neurology

## 2024-02-20 DIAGNOSIS — Z6835 Body mass index (BMI) 35.0-35.9, adult: Secondary | ICD-10-CM

## 2024-02-20 DIAGNOSIS — N951 Menopausal and female climacteric states: Secondary | ICD-10-CM

## 2024-02-20 DIAGNOSIS — R0683 Snoring: Secondary | ICD-10-CM

## 2024-02-20 DIAGNOSIS — G4733 Obstructive sleep apnea (adult) (pediatric): Secondary | ICD-10-CM | POA: Diagnosis not present

## 2024-02-20 DIAGNOSIS — R0681 Apnea, not elsewhere classified: Secondary | ICD-10-CM

## 2024-03-11 NOTE — Progress Notes (Signed)
 [facility]   HOME SLEEP TEST REPORT ( by Leretha Dykes  mail -out device )   STUDY DATE: 02-24-2024   Laura Rice "Laura Rice" 58 year old female 12/16/1966 Data received : [id number]   ORDERING CLINICIAN:  REFERRING CLINICIAN:   CLINICAL INFORMATION/HISTORY:Laura Rice is a 58 y.o. female patient of Laura Rice; who is seen upon referral on 01/15/2024 for an evaluation of snoring and fatigue. She has menopausal hot flushes, while on HRT.  In September 2023 she underwent hysterectomy and her PCP has recently increased her HRT to twice a week  patches. Estradiol patches are a combi of  1.0 and 0.5 mg. Her pillow is sweat drenched in AM.  Her spouse noted snoring, crescendo snoring , and pauses in nocturnal breathing.  She is  more fatigued, reports non restorative sleep, even after 8 hours of sleep.  I have the pleasure of seeing Laura Rice on 91/47/82 .   Epworth sleepiness score: 2/ 24 points   FSS endorsed at 41/ 63 points.  BMI:  32.6 kg/m Neck Circumference:  14.75  Nasal airflow is not fully patent.  Retrognathia is not seen. Underbite, had wisdom teeth removed.    FINDINGS: Sleep Summary:   Total Recording Time (hours, min):   9 hours 20 minutes  Total Sleep Time (hours, min):   8 hours 40 minutes  Sleep efficiency %;   87%                                    Respiratory Indices: by AAS/ by CMS    Calculated pAHI (per hour):   9.3/h                                              Positional  respiratory activity  / snoring : Moderate to loud for the majority of the recorded sleep time.  Oxygen Saturation  in Sleep    Oxygen Saturation (%) Mean: 92.3%                 O2 Saturation Range (%):    Between and nadir at 82.2% and a maximum saturation of 96.5%                                   O2 Saturation (minutes) <89%: 6 minutes         Pulse Rate in Sleep :   Pulse Mean (bpm):   Between 60 and 96 bpm with a mean heart rate in sleep of 67 bpm.   Heart rate was in normal sinus rhythm during sleep.                    IMPRESSION:  This HST confirms the presence of mild obstructive sleep apnea with moderate to loud snoring and mild sleep hypoxia.   RECOMMENDATION: Since Laura Rice is struggling with sleep quality and quantity I think that a trial of CPAP may bring her relief.  I would use an auto titration CPAP device with a pressure setting between 5 and 15 cm water, 2 cm EPR humidification and an interface of her comfort and choice.  In order to facilitate breathing through  the nose it may be beneficial to use a nasal spray at night such as normal saline nasal spray to rinse the nasal cavity.  This will also make the use of CPAP more pleasant.  If CPAP is not what the patient has in mind we could use a referral to a sleep dentist to reduce her apnea usually about 50% and this would place the patient into a AHI (apnea hypopnea index ) category that would be physiologically normal.  Weight loss will likely be the key for a sustained reduction in snoring and sleep hypopnea.  I will not place a referral for a CPAP treatment yet in her chart and wait for her response to my recommendations before I order.    NTERPRETING PHYSICIAN:   Laura Novas, MD  [facility] Sleep Board certified by The [employer] Sleep Medicine and Diplomate of the [employer]. Board certified In Neurology through the ABPN, Fellow of the [employer].

## 2024-03-21 ENCOUNTER — Encounter: Payer: Self-pay | Admitting: Neurology

## 2024-03-21 NOTE — Procedures (Signed)
 [facility]   HOME SLEEP TEST REPORT ( by Leretha Dykes  mail -out device )   STUDY DATE: 02-24-2024   Laura Rice "Laura Rice" 58 year old female 12/16/1966 Data received : 3-20-205   ORDERING CLINICIAN:  REFERRING CLINICIAN:   CLINICAL INFORMATION/HISTORY:Laura Rice is a 58 y.o. female patient of Dr Salomon Fick; who is seen upon referral on 01/15/2024 for an evaluation of snoring and fatigue. She has menopausal hot flushes, while on HRT.  In September 2023 she underwent hysterectomy and her PCP has recently increased her HRT to twice a week  patches. Estradiol patches are a combi of  1.0 and 0.5 mg. Her pillow is sweat drenched in AM.  Her spouse noted snoring, crescendo snoring , and pauses in nocturnal breathing.  She is  more fatigued, reports non restorative sleep, even after 8 hours of sleep.  I have the pleasure of seeing Laura Rice on 91/47/82 .   Epworth sleepiness score: 2/ 24 points   FSS endorsed at 41/ 63 points.  BMI:  32.6 kg/m Neck Circumference:  14.75  Nasal airflow is not fully patent.  Retrognathia is not seen. Underbite, had wisdom teeth removed.    FINDINGS: Sleep Summary:   Total Recording Time (hours, min):   9 hours 20 minutes  Total Sleep Time (hours, min):   8 hours 40 minutes  Sleep efficiency %;   87%                                    Respiratory Indices: by AAS/ by CMS    Calculated pAHI (per hour):   9.3/h                                              Positional  respiratory activity  / snoring : Moderate to loud for the majority of the recorded sleep time.  Oxygen Saturation  in Sleep    Oxygen Saturation (%) Mean: 92.3%                 O2 Saturation Range (%):    Between and nadir at 82.2% and a maximum saturation of 96.5%                                   O2 Saturation (minutes) <89%: 6 minutes         Pulse Rate in Sleep :   Pulse Mean (bpm):   Between 60 and 96 bpm with a mean heart rate in sleep of 67 bpm.   Heart rate was in normal sinus rhythm during sleep.                    IMPRESSION:  This HST confirms the presence of mild obstructive sleep apnea with moderate to loud snoring and mild sleep hypoxia.   RECOMMENDATION: Since Laura Rice is struggling with sleep quality and quantity I think that a trial of CPAP may bring her relief.  I would use an auto titration CPAP device with a pressure setting between 5 and 15 cm water, 2 cm EPR humidification and an interface of her comfort and choice.  In order to facilitate breathing through  the nose it may be beneficial to use a nasal spray at night such as normal saline nasal spray to rinse the nasal cavity.  This will also make the use of CPAP more pleasant.  If CPAP is not what the patient has in mind we could use a referral to a sleep dentist to reduce her apnea usually about 50% and this would place the patient into a AHI (apnea hypopnea index ) category that would be physiologically normal.  Weight loss will likely be the key for a sustained reduction in snoring and sleep hypopnea.  I will not place a referral for a CPAP treatment yet in her chart and wait for her response to my recommendations before I order.    NTERPRETING PHYSICIAN:   Melvyn Novas, MD  [facility] Sleep Board certified by [employer] Sleep Medicine and Diplomate of the [employer]. Board certified In Neurology through the ABPN, Fellow of the [employer].

## 2024-03-24 ENCOUNTER — Telehealth: Payer: Self-pay

## 2024-03-24 NOTE — Telephone Encounter (Signed)
-----   Message from Riverdale Dohmeier sent at 03/21/2024  3:18 PM EDT ----- This HST confirms the presence of mild obstructive sleep apnea with moderate to loud snoring and mild sleep hypoxia.   RECOMMENDATION: Since Mrs. Laura Rice is struggling with sleep quality and quantity I think that a trial of CPAP may bring her relief.  I would use an auto titration CPAP device with a pressure setting between 5 and 15 cm water, 2 cm EPR humidification and an interface of her comfort and choice.  In order to facilitate breathing through the nose it may be beneficial to use a nasal spray at night such as normal saline nasal spray to rinse the nasal cavity.  This will also make the use of CPAP more pleasant.  If CPAP is not what the patient has in mind we could use a referral to a sleep dentist to reduce her apnea usually about 50% and this would place the patient into a AHI (apnea hypopnea index ) category that would be physiologically normal.   Weight loss will likely be the key for a sustained reduction in snoring and sleep hypopnea.   I will not place a referral for a CPAP treatment yet in her chart and wait for her response to my recommendations before I order.

## 2024-03-24 NOTE — Telephone Encounter (Signed)
 Called pt to discuss her option for using a CPAP for treatment for OSA. Pt has seen her My chart message and we needing an update. LVM to give [place] a call back to discuss options.

## 2024-03-29 ENCOUNTER — Telehealth: Payer: Self-pay

## 2024-03-29 NOTE — Telephone Encounter (Signed)
-----   Message from Riverdale Dohmeier sent at 03/21/2024  3:18 PM EDT ----- This HST confirms the presence of mild obstructive sleep apnea with moderate to loud snoring and mild sleep hypoxia.   RECOMMENDATION: Since Mrs. Laura Rice is struggling with sleep quality and quantity I think that a trial of CPAP may bring her relief.  I would use an auto titration CPAP device with a pressure setting between 5 and 15 cm water, 2 cm EPR humidification and an interface of her comfort and choice.  In order to facilitate breathing through the nose it may be beneficial to use a nasal spray at night such as normal saline nasal spray to rinse the nasal cavity.  This will also make the use of CPAP more pleasant.  If CPAP is not what the patient has in mind we could use a referral to a sleep dentist to reduce her apnea usually about 50% and this would place the patient into a AHI (apnea hypopnea index ) category that would be physiologically normal.   Weight loss will likely be the key for a sustained reduction in snoring and sleep hypopnea.   I will not place a referral for a CPAP treatment yet in her chart and wait for her response to my recommendations before I order.

## 2024-03-29 NOTE — Telephone Encounter (Signed)
Called patient to discuss sleep study results. No answer at this time. LVM for the patient to call back.   

## 2024-06-05 ENCOUNTER — Other Ambulatory Visit: Payer: Self-pay | Admitting: Family Medicine

## 2024-06-05 DIAGNOSIS — E782 Mixed hyperlipidemia: Secondary | ICD-10-CM

## 2024-07-29 ENCOUNTER — Ambulatory Visit: Payer: PRIVATE HEALTH INSURANCE | Admitting: Family Medicine

## 2024-07-29 VITALS — BP 132/80 | HR 75 | Temp 98.4°F | Ht 63.0 in | Wt 182.6 lb

## 2024-07-29 DIAGNOSIS — N1831 Chronic kidney disease, stage 3a: Secondary | ICD-10-CM

## 2024-07-29 DIAGNOSIS — R899 Unspecified abnormal finding in specimens from other organs, systems and tissues: Secondary | ICD-10-CM | POA: Diagnosis not present

## 2024-07-29 NOTE — Progress Notes (Signed)
 Established Patient Office Visit   Subjective  Patient ID: Laura Rice, female    DOB: [birth year]  Age: 58 y.o. MRN: [id number]  Chief Complaint  Patient presents with   Medical Management of Chronic Issues    Patient came in today for Labs results from OBGYN office, Glucose-101, Creatinine -1.18, EGFR-54, BUN-8, Potassium- 5.3     Pt is a 58 year old female seen for follow-up on labs from outside provider.  Patient states she had labs at OB/GYN yesterday and had concerns regarding kidney function and elevated glucose.  Patient was not fasting prior to labs as had coffee with creamer.  Glucose was 101, creatinine 1.18, and EGFR 54.  Denies recent changes in medications or diet.  Drinking 1.5 large cups of water per day~60 oz. patient's mom had a history of renal disease on HD.    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 Clotilda Single, MD, [place] 07/17/22.   Migraines    just treats with Tylenol  OTC,  maybe one or two month   Peptic ulcer    diagnosed 1989, last issue with peptic ulcer around 2001 per pt on 8/18//23   PONV (postoperative nausea and vomiting)    hx of severe nausea & vomiting after surgery   Prediabetes    07/17/22 Hgb A1C 6.1   Uterine fibroid 2023   Wears glasses    Past Surgical History:  Procedure Laterality Date   CYSTOSCOPY N/A 08/29/2022   Procedure: CYSTOSCOPY;  Surgeon: Danielle Rom, MD;  Location: [facility];  Service: Gynecology;   Laterality: N/A;   LAPAROSCOPIC LYSIS OF ADHESIONS  08/29/2022   Procedure: LAPAROSCOPIC LYSIS OF ADHESIONS;  Surgeon: Danielle Rom, MD;  Location: [facility];  Service: Gynecology;;   LAPAROSCOPIC VAGINAL HYSTERECTOMY WITH SALPINGECTOMY Bilateral 08/29/2022   Procedure: LAPAROSCOPIC ASSISTED VAGINAL HYSTERECTOMY WITH SALPINGECTOMY;  Surgeon: Danielle Rom, MD;  Location: [facility];  Service: Gynecology;  Laterality: Bilateral;   NOVASURE ABLATION  2008   THYROIDECTOMY, PARTIAL  2010   TUBAL LIGATION  1993   Social History   Tobacco Use   Smoking status: Never   Smokeless tobacco: Never  Vaping Use   Vaping status: Never Used  Substance Use Topics   Alcohol use: Yes    Comment: about 2 or 3 glasses of wine or mixed drinks per month   Drug use: Never   Family History  Problem Relation Age of Onset   Alcohol abuse Brother    Diabetes Brother    Early death Brother    Arthritis Maternal Grandmother    Diabetes Maternal Grandmother    Arthritis Maternal Grandfather    Asthma Paternal Grandmother    Early death Paternal Grandmother    Alcohol abuse Daughter    Drug abuse Daughter    Asthma Son    Breast cancer Neg Hx    Sleep apnea Neg Hx    No Known Allergies  ROS Negative unless  stated above    Objective:     BP 132/80 (BP Location: Left Arm, Patient Position: Sitting, Cuff Size: Large)   Pulse 75   Temp 98.4 F (36.9 C) (Oral)   Ht 5' 3 (1.6 m)   Wt 182 lb 9.6 oz (82.8 kg)   SpO2 100%   BMI 32.35 kg/m  BP Readings from Last 3 Encounters:  07/29/24 132/80  01/22/24 122/70  01/15/24 134/74   Wt Readings from Last 3 Encounters:  07/29/24 182 lb 9.6 oz (82.8 kg)  01/22/24 184 lb 6.4 oz (83.6 kg)  01/15/24 184 lb (83.5 kg)      Physical Exam Constitutional:      Appearance: Normal appearance.  HENT:     Head: Normocephalic and atraumatic.  Cardiovascular:     Rate and Rhythm: Normal rate.  Pulmonary:      Effort: Pulmonary effort is normal.  Skin:    General: Skin is warm and dry.  Neurological:     Mental Status: She is alert and oriented to person, place, and time.        12/04/2023    8:41 AM 04/07/2023    4:47 PM 07/17/2022    9:47 AM  Depression screen PHQ 2/9  Decreased Interest 0 0 0  Down, Depressed, Hopeless 0 1 0  PHQ - 2 Score 0 1 0  Altered sleeping 3 0 0  Tired, decreased energy 3 2 0  Change in appetite 0 0 0  Feeling bad or failure about yourself  0 0 0  Trouble concentrating 0 0 0  Moving slowly or fidgety/restless 0 0 0  Suicidal thoughts 0 0 0  PHQ-9 Score 6 3 0  Difficult doing work/chores Not difficult at all Not difficult at all       12/04/2023    8:41 AM 04/07/2023    4:48 PM 07/09/2021   10:51 AM  GAD 7 : Generalized Anxiety Score  Nervous, Anxious, on Edge 0 0 0  Control/stop worrying 0 0 0  Worry too much - different things 0 0 0  Trouble relaxing 0 0 0  Restless 0 0 0  Easily annoyed or irritable 0 0 0  Afraid - awful might happen 0 0 0  Total GAD 7 Score 0 0 0  Anxiety Difficulty Not difficult at all Not difficult at all      No results found for any visits on 07/29/24.    Assessment & Plan:   Stage 3a chronic kidney disease (HCC)  Abnormal laboratory test result  Recent nonfasting labs from OB/GYN reviewed with patient.  Creatinine 1.18 and EGFR 54.  Upon chart review eGFR 57 and creatinine 1.07 on 01/22/2024.  CKD 3a.  Discussed ways to slow the progression of kidney dz including increasing water intake, decreasing NSAID use, BP control, etc.  Continue to monitor.  Recheck in 3-4 months.    Other labs reviewed. Glucose appropriate.  No follow-ups on file.  Follow-up as needed.  Clotilda JONELLE Single, MD

## 2024-12-24 ENCOUNTER — Other Ambulatory Visit: Payer: Self-pay | Admitting: Medical Genetics

## 2024-12-30 ENCOUNTER — Other Ambulatory Visit: Payer: Self-pay

## 2024-12-30 ENCOUNTER — Ambulatory Visit: Payer: PRIVATE HEALTH INSURANCE | Attending: Obstetrics and Gynecology | Admitting: Physical Therapy

## 2024-12-30 ENCOUNTER — Encounter: Payer: Self-pay | Admitting: Physical Therapy

## 2024-12-30 DIAGNOSIS — M6281 Muscle weakness (generalized): Secondary | ICD-10-CM | POA: Insufficient documentation

## 2024-12-30 DIAGNOSIS — R293 Abnormal posture: Secondary | ICD-10-CM | POA: Diagnosis not present

## 2024-12-30 DIAGNOSIS — N393 Stress incontinence (female) (male): Secondary | ICD-10-CM | POA: Diagnosis present

## 2024-12-30 DIAGNOSIS — R279 Unspecified lack of coordination: Secondary | ICD-10-CM | POA: Insufficient documentation

## 2024-12-30 NOTE — Therapy (Signed)
 " OUTPATIENT PHYSICAL THERAPY FEMALE PELVIC EVALUATION   Patient Name: Laura Rice MRN: [id number] DOB:1966-09-27, 59 y.o., female Today's Date: 12/30/2024  END OF SESSION:  PT End of Session - 12/30/24 1008     Visit Number 1    Number of Visits 8    Date for Recertification  02/24/25    Authorization Type Generic Commercial    Authorization Time Period no auth req    PT Start Time 0930    PT Stop Time 1015    PT Time Calculation (min) 45 min    Activity Tolerance Patient tolerated treatment well;No increased pain    Behavior During Therapy [facility] for tasks assessed/performed         Past Medical History:  Diagnosis Date   Allergy    Asthma    Colon polyps    colonoscopy 05/23/16  in [place] fever    Hyperlipidemia    Pt follows w/ PCP Dr. Clotilda Single, MD, [place] 07/17/22.   Migraines    just treats with Tylenol  OTC,  maybe one or two month   Peptic ulcer    diagnosed 1989, last issue with peptic ulcer around 2001 per pt on 8/18//23   PONV (postoperative nausea and vomiting)    hx of severe nausea & vomiting after surgery   Prediabetes    07/17/22 Hgb A1C 6.1   Uterine fibroid 2023   Wears glasses    Past Surgical History:  Procedure Laterality Date   CYSTOSCOPY N/A 08/29/2022   Procedure: CYSTOSCOPY;  Surgeon: Danielle Rom, MD;  Location: [facility];  Service: Gynecology;  Laterality: N/A;   LAPAROSCOPIC LYSIS OF ADHESIONS  08/29/2022   Procedure: LAPAROSCOPIC LYSIS OF ADHESIONS;  Surgeon: Danielle Rom, MD;  Location: [facility];  Service: Gynecology;;   LAPAROSCOPIC VAGINAL HYSTERECTOMY WITH SALPINGECTOMY Bilateral 08/29/2022   Procedure: LAPAROSCOPIC ASSISTED VAGINAL HYSTERECTOMY WITH SALPINGECTOMY;  Surgeon: Danielle Rom, MD;  Location: [facility];  Service: Gynecology;  Laterality: Bilateral;   NOVASURE ABLATION  2008   THYROIDECTOMY, PARTIAL  2010   TUBAL LIGATION  1993    Patient Active Problem List   Diagnosis Date Noted   Menopausal hyperhidrosis 01/15/2024   Snoring 01/15/2024   Witnessed episode of apnea 01/15/2024   S/P laparoscopic assisted vaginal hysterectomy (LAVH) 08/29/2022   Fibroids 02/22/2022   Hyperlipidemia 02/03/2019   Seasonal allergies 02/03/2019   History of migraine 02/03/2019   History of asthma 02/03/2019   Menopause 03/10/2013   PCP: Single Clotilda SAUNDERS, MD  REFERRING PROVIDER: Danielle Rom, MD   REFERRING DIAG: N39.3 (ICD-10-CM) - Stress incontinence (female) (female)  THERAPY DIAG:  Muscle weakness (generalized)  Unspecified lack of coordination  Abnormal posture  Rationale for Evaluation and Treatment: Rehabilitation  ONSET DATE: 4 years   SUBJECTIVE:  SUBJECTIVE STATEMENT: From eval: Patient reports to PFPT with urinary leakage and some vaginal wall laxity/pressure that has been causing her to need to splint in the perineal body when passing bowel movements. Her OBGYN recommended pelvic PT. This pressure feels like heaviness and it is intermittent in nature.  Fluid intake: moderate water intake depending on the day; coffee 20 oz in the morning; sometimes a hot tea   FUNCTIONAL LIMITATIONS: feeling like she needs to have a bathroom nearby   PERTINENT HISTORY:  Medications for current condition: none  Surgeries: hysterectomy 2023; tubal ligation '93 Other: N/A Sexual abuse: No  PAIN:  Are you having pain? No - just pressure  NPRS scale: 0/10  PRECAUTIONS: None  RED FLAGS: None   WEIGHT BEARING RESTRICTIONS: No  FALLS:  Has patient fallen in last 6 months? 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   ACTIVITY LEVEL : low; wishes to increase her activity level    PLOF: Independent  PATIENT GOALS: strengthen the vaginal canal so the pressure is less; improve urinary control  BOWEL MOVEMENT: Pain with bowel movement: No Type of bowel movement:Type (Bristol Stool Scale) 2,3,4, Frequency 1x/day, Strain depends on what time of day it is, and Splinting yes in the perineal space  Fully empty rectum: No Leakage: No                                                  Caused by: no Bowel urgency: no Pads: No Fiber supplement/laxative No  URINATION: Pain with urination: No Fully empty bladder: Yes: but could go back shortly                                          Post-void dribble: No Stream: Strong Urgency: Yes  Frequency:during the day within normal limits                                                         Nocturia: Yes: at least one time per night   Leakage: Urge to void and Walking to the bathroom Pads/briefs: No  INTERCOURSE:  Ability to have vaginal penetration Yes  Pain with intercourse: During Penetration Dryness: No Climax: yes  Marinoff Scale: 2/3 Lubricant: no   PREGNANCY: Vaginal deliveries 2 Tearing Yes: with the first delivery  Episiotomy No C-section deliveries 0 Currently pregnant No  PROLAPSE: Pressure: notices this more when she is on her feet   OBJECTIVE:  Note: Objective measures were completed at Evaluation unless otherwise noted.  PATIENT SURVEYS:  PFIQ-7: 45  COGNITION: Overall cognitive status: Within functional limits for tasks assessed     SENSATION: Light touch: Appears intact  LUMBAR SPECIAL TESTS:  Straight leg raise test: Positive  FUNCTIONAL TESTS:  Single leg stance:  Rt: +   Lt: +  Sit-up test: 1/3 Squat: within normal limits  Bed mobility: within functional limits   GAIT: Assistive device utilized: None Comments: mild trendelenburg gait pattern with ambulation   POSTURE: rounded shoulders  LUMBARAROM/PROM: within normal limits  bilaterally with no pain   LOWER EXTREMITY  ROM: within normal limits bilaterally with no pain   LOWER EXTREMITY MMT:  MMT Right eval Left eval  Hip flexion 4 5  Hip extension    Hip abduction 5 5  Hip adduction 5 5  Hip internal rotation    Hip external rotation    Knee flexion 4 4  Knee extension 5 5  Ankle dorsiflexion    Ankle plantarflexion    Ankle inversion    Ankle eversion     (Blank rows = not tested) PALPATION:  General: no tenderness to palpation of bilateral adductors or pubic bone   Pelvic Alignment: within normal limits; slight anterior pelvic tilt at rest   Abdominal: abdominal bracing at rest  Diastasis: No Distortion: No  Breathing: apical breathing pattern with decreased lower rib excursion during inhalation  Scar tissue: Yes:   Active Straight Leg Raise: +                 External Perineal Exam: scar tissue present in perineal space that is rigid and adhered but not painful; mild dryness present with sufficient clitoral hood mobility                              Internal Pelvic Floor: Patient demonstrates deep pelvic floor muscle tension in bilateral obturators that is not painful with palpation. She has a moderate strength pelvic floor contraction that is not coordinated with her diaphragm. She demonstrates posterior vaginal wall laxity with cough test in hooklying.   Patient confirms identification and approves PT to assess internal pelvic floor and treatment Yes No emotional/communication barriers or cognitive limitation. Patient is motivated to learn. Patient understands and agrees with treatment goals and plan. PT explains patient will be examined in standing, sitting, and lying down to see how their muscles and joints work. When they are ready, they will be asked to remove their underwear so PT can examine their perineum. The patient is also given the option of providing their own chaperone as one is not provided in our facility. The patient also has the right and is explained the right to defer  or refuse any part of the evaluation or treatment including the internal exam. With the patient's consent, PT will use one gloved finger to gently assess the muscles of the pelvic floor, seeing how well it contracts and relaxes and if there is muscle symmetry. After, the patient will get dressed and PT and patient will discuss exam findings and plan of care. PT and patient discuss plan of care, schedule, attendance policy and HEP activities. All internal or external pelvic floor assessments and/or treatments are completed with proper hand hygiene and gloves hands. If needed gloves are changed with hand hygiene during patient care time.  PELVIC MMT: MMT eval  Vaginal 3/5, 10 quick flicks, 3 sec hold  Internal Anal Sphincter   External Anal Sphincter   Puborectalis   (Blank rows = not tested)       TONE: High tone in deep pelvic floor musculature bilaterally; low tone superficially   PROLAPSE: Posterior vaginal wall laxity present in hooklying with cough test   TODAY'S TREATMENT:  DATE:   EVAL 12/30/24: Examination completed, findings reviewed, pt educated on POC, HEP, and self care. Pt motivated to participate in PT and agreeable to attempt recommendations.   Hooklying pelvic floor contraction + diaphragmatic breathing 2x10  Hooklying quick flick contractions + diaphragmatic breathing 2x10  - Get To Know Your Pelvic Floor- Female - Urinary Incontinence - High-Fiber Diet to Support Pelvic Health - Lifestyle Changes that Support Urinary Health  PATIENT EDUCATION:  Education details: - Get To Know Your Pelvic Floor- Female - Urinary Incontinence - High-Fiber Diet to Support Pelvic Health - Lifestyle Changes that Support Urinary Health Person educated: Patient Education method: Explanation, Demonstration, Tactile cues, Verbal cues, and Handouts Education  comprehension: verbalized understanding, returned demonstration, verbal cues required, tactile cues required, and needs further education  HOME EXERCISE PROGRAM: Access Code: [email or web address] URL: [email or web address] Date: 12/30/2024 Prepared by: Celena Domino  Exercises - Supine Pelvic Floor Contraction  - 1 x daily - 7 x weekly - 3 sets - 10 reps - Quick Flick Pelvic Floor Contractions in Hooklying  - 1 x daily - 7 x weekly - 3 sets - 10 reps  Patient Education - Get To Know Your Pelvic Floor- Female - Urinary Incontinence - High-Fiber Diet to Support Pelvic Health - Lifestyle Changes that Support Urinary Health  ASSESSMENT:  CLINICAL IMPRESSION: Patient is a 59 y.o. female  who was seen today for physical therapy evaluation and treatment for urinary leakage and vaginal wall laxity. She has been experiencing these symptoms for ~4 years and has had two vaginal deliveries with tearing in the perineal space. She reports urinary leakage with a high sense of urgency and she experiences vaginal heaviness that feels like pressure. This sensation fluctuates depending on her activity levels on a given day. Patient demonstrates normal lumbar ROM, sufficient hip strength, and breathes apically with decreased lower rib excursion. Patient demonstrates deep pelvic floor muscle tension in bilateral obturators that is not painful with palpation. She has a moderate strength pelvic floor contraction that is not coordinated with her diaphragm. She demonstrates posterior vaginal wall laxity with cough test in hooklying. Overall, pt tolerated session well and Pt would benefit from additional PT to further address urinary leakage, pelvic floor weakness, and vaginal wall laxity.    OBJECTIVE IMPAIRMENTS: decreased coordination, decreased endurance, decreased mobility, decreased ROM, decreased strength, impaired flexibility, and pain.   ACTIVITY LIMITATIONS: standing, continence, toileting, and being on  feet for long periods of time   PARTICIPATION LIMITATIONS: interpersonal relationship, community activity, and occupation  PERSONAL FACTORS: Age, Past/current experiences, and Time since onset of injury/illness/exacerbation are also affecting patient's functional outcome.   REHAB POTENTIAL: Good  CLINICAL DECISION MAKING: Stable/uncomplicated  EVALUATION COMPLEXITY: Low   GOALS: Goals reviewed with patient? Yes  SHORT TERM GOALS: Target date: 01/27/2025  Pt will be independent with HEP.  Baseline: Goal status: INITIAL  2.  Pt will be independent with diaphragmatic breathing and down training activities in order to improve pelvic floor relaxation. Baseline:  Goal status: INITIAL  3.  Pt will be independent with the knack, urge suppression technique, and double voiding in order to improve bladder habits and decrease urinary incontinence.  Baseline:  Goal status: INITIAL  4.  Pt will be independent with use of squatty potty, relaxed toileting mechanics, and improved bowel movement techniques in order to increase ease of bowel movements and complete evacuation.  Baseline:  Goal status: INITIAL  LONG TERM GOALS: Target date: 06/29/2025  Pt will be independent with advanced HEP.  Baseline:  Goal status: INITIAL  2.  Pt to demonstrate improved coordination of pelvic floor and breathing mechanics with 10# squat with appropriate synergistic patterns to decrease pain and leakage at least 75% of the time for improved ability to complete a 30 minute workout with strain at pelvic floor and symptoms.   Baseline:  Goal status: INITIAL  3.  Pt will be able to correctly perform diaphragmatic breathing and appropriate pressure management in order to prevent worsening vaginal wall laxity and improve pelvic floor A/ROM.  Baseline:  Goal status: INITIAL  4.  Pt will demonstrate normal pelvic floor muscle tone and A/ROM, able to achieve 4/5 strength with contractions and 10 sec endurance, in  order to reduce urinary leaking and number of pads patient wears.   Baseline:  Goal status: INITIAL  PLAN:  PT FREQUENCY: 1-2x/week  PT DURATION: 6 months  PLANNED INTERVENTIONS: 97110-Therapeutic exercises, 97530- Therapeutic activity, 97112- Neuromuscular re-education, 97535- Self Care, [id number]- Manual therapy, Patient/Family education, Taping, Joint mobilization, Spinal mobilization, Scar mobilization, Cryotherapy, Moist heat, and Biofeedback  PLAN FOR NEXT SESSION: continued pelvic floor trianing in seated position; introduce toileting mechanics; discuss pressure management for prolapse; decreasing fluid intake 2 hours before bedtime; core and hip strengthening  Celena JAYSON Domino, PT 12/30/2024, 10:09 AM [facility] [address], [address] [place], [place] [address] Phone # [phone] Fax [phone]  "

## 2024-12-31 ENCOUNTER — Encounter: Payer: Self-pay | Admitting: Family Medicine

## 2024-12-31 ENCOUNTER — Ambulatory Visit: Payer: PRIVATE HEALTH INSURANCE | Admitting: Family Medicine

## 2024-12-31 VITALS — BP 124/82 | HR 98 | Temp 98.8°F | Ht 63.0 in | Wt 178.0 lb

## 2024-12-31 DIAGNOSIS — M21612 Bunion of left foot: Secondary | ICD-10-CM

## 2024-12-31 DIAGNOSIS — Z0000 Encounter for general adult medical examination without abnormal findings: Secondary | ICD-10-CM

## 2024-12-31 DIAGNOSIS — M79671 Pain in right foot: Secondary | ICD-10-CM

## 2024-12-31 DIAGNOSIS — L304 Erythema intertrigo: Secondary | ICD-10-CM | POA: Diagnosis not present

## 2024-12-31 DIAGNOSIS — M21611 Bunion of right foot: Secondary | ICD-10-CM | POA: Diagnosis not present

## 2024-12-31 DIAGNOSIS — N951 Menopausal and female climacteric states: Secondary | ICD-10-CM | POA: Diagnosis not present

## 2024-12-31 DIAGNOSIS — R200 Anesthesia of skin: Secondary | ICD-10-CM

## 2024-12-31 DIAGNOSIS — R202 Paresthesia of skin: Secondary | ICD-10-CM

## 2025-01-03 ENCOUNTER — Other Ambulatory Visit: Payer: PRIVATE HEALTH INSURANCE

## 2025-01-03 DIAGNOSIS — R200 Anesthesia of skin: Secondary | ICD-10-CM

## 2025-01-03 DIAGNOSIS — Z0000 Encounter for general adult medical examination without abnormal findings: Secondary | ICD-10-CM | POA: Diagnosis not present

## 2025-01-03 DIAGNOSIS — R202 Paresthesia of skin: Secondary | ICD-10-CM | POA: Diagnosis not present

## 2025-01-03 LAB — LIPID PANEL
Cholesterol: 222 mg/dL — ABNORMAL HIGH (ref 28–200)
HDL: 53.6 mg/dL
LDL Cholesterol: 118 mg/dL — ABNORMAL HIGH (ref 10–99)
NonHDL: 168.24
Total CHOL/HDL Ratio: 4
Triglycerides: 253 mg/dL — ABNORMAL HIGH (ref 10.0–149.0)
VLDL: 50.6 mg/dL — ABNORMAL HIGH (ref 0.0–40.0)

## 2025-01-03 LAB — COMPREHENSIVE METABOLIC PANEL WITH GFR
ALT: 17 U/L (ref 3–35)
AST: 21 U/L (ref 5–37)
Albumin: 4.7 g/dL (ref 3.5–5.2)
Alkaline Phosphatase: 66 U/L (ref 39–117)
BUN: 13 mg/dL (ref 6–23)
CO2: 31 meq/L (ref 19–32)
Calcium: 9.9 mg/dL (ref 8.4–10.5)
Chloride: 101 meq/L (ref 96–112)
Creatinine, Ser: 1.29 mg/dL — ABNORMAL HIGH (ref 0.40–1.20)
GFR: 45.7 mL/min — ABNORMAL LOW
Glucose, Bld: 104 mg/dL — ABNORMAL HIGH (ref 70–99)
Potassium: 4.1 meq/L (ref 3.5–5.1)
Sodium: 140 meq/L (ref 135–145)
Total Bilirubin: 0.8 mg/dL (ref 0.2–1.2)
Total Protein: 7.8 g/dL (ref 6.0–8.3)

## 2025-01-03 LAB — CBC WITH DIFFERENTIAL/PLATELET
Basophils Absolute: 0 K/uL (ref 0.0–0.1)
Basophils Relative: 1 % (ref 0.0–3.0)
Eosinophils Absolute: 0.2 K/uL (ref 0.0–0.7)
Eosinophils Relative: 3.8 % (ref 0.0–5.0)
HCT: 46.8 % — ABNORMAL HIGH (ref 36.0–46.0)
Hemoglobin: 15.5 g/dL — ABNORMAL HIGH (ref 12.0–15.0)
Lymphocytes Relative: 49.3 % — ABNORMAL HIGH (ref 12.0–46.0)
Lymphs Abs: 2.1 K/uL (ref 0.7–4.0)
MCHC: 33.2 g/dL (ref 30.0–36.0)
MCV: 84.9 fl (ref 78.0–100.0)
Monocytes Absolute: 0.4 K/uL (ref 0.1–1.0)
Monocytes Relative: 8.6 % (ref 3.0–12.0)
Neutro Abs: 1.6 K/uL (ref 1.4–7.7)
Neutrophils Relative %: 37.3 % — ABNORMAL LOW (ref 43.0–77.0)
Platelets: 233 K/uL (ref 150.0–400.0)
RBC: 5.5 Mil/uL — ABNORMAL HIGH (ref 3.87–5.11)
RDW: 13.5 % (ref 11.5–15.5)
WBC: 4.3 K/uL (ref 4.0–10.5)

## 2025-01-03 LAB — HEMOGLOBIN A1C: Hgb A1c MFr Bld: 6.2 % (ref 4.6–6.5)

## 2025-01-03 LAB — T4, FREE: Free T4: 0.69 ng/dL (ref 0.60–1.60)

## 2025-01-03 LAB — TSH: TSH: 5.88 u[IU]/mL — ABNORMAL HIGH (ref 0.35–5.50)

## 2025-01-03 LAB — VITAMIN B12: Vitamin B-12: 729 pg/mL (ref 211–911)

## 2025-01-04 ENCOUNTER — Ambulatory Visit: Payer: PRIVATE HEALTH INSURANCE | Admitting: Physical Therapy

## 2025-01-04 DIAGNOSIS — R279 Unspecified lack of coordination: Secondary | ICD-10-CM

## 2025-01-04 DIAGNOSIS — N393 Stress incontinence (female) (male): Secondary | ICD-10-CM | POA: Diagnosis not present

## 2025-01-04 DIAGNOSIS — R293 Abnormal posture: Secondary | ICD-10-CM

## 2025-01-04 DIAGNOSIS — M6281 Muscle weakness (generalized): Secondary | ICD-10-CM

## 2025-01-04 NOTE — Patient Instructions (Signed)
 Toileting Mechanics 101: Urination  Positioning: sit all the way down on the toilet, feet flat on the floor, trunk relaxed Hovering over the toilet seat can impact your ability to relax the pelvic floor musculature and empty your bladder well  When you initiate urination, try to let the urine flow out naturally rather than pushing down. Pushing can limit the ability of your urethral sphincter to relax and open. Double voiding technique: When you think you are done urinating, try gently pushing to see if there is any remaining urine that can be expelled   You can try the following techniques to see if there is any remaining urine that can be expelled: Rocking back and forth Leaning side to side  Twisting your trunk  Taking deep belly breaths to promote blood flow to pelvic floor musculature  Defecation  Positioning: sit all the way down on the toilet, feet flat on the floor, trunk relaxed You can try using a squatty potty or toilet stool under your feet to change the angle of your rectum in your pelvic floor, making it easier to pass stool with less straining  Avoid straining or breath holding while on the toilet. This causes increased intra-abdominal pressure, which causes increased pressure down through the pelvic floor. We want to avoid this if possible. If you do feel the need to push to pass bowel movements, practice the following technique instead: Imagine you are fogging up a mirror with your breath as you exhale and gently push down and out through your pelvic floor. This relieves some pressure while you're still able to push the stool out.  You can try the following techniques to see if there is any remaining stool that can be expelled: Rocking back and forth Leaning side to side  Twisting your trunk  Taking deep belly breaths to promote blood flow to pelvic floor musculature

## 2025-01-04 NOTE — Therapy (Signed)
 " OUTPATIENT PHYSICAL THERAPY FEMALE PELVIC TREATMENT   Patient Name: Laura Rice MRN: [id number] DOB:1966-12-19, 59 y.o., female Today's Date: 01/04/2025  END OF SESSION:  PT End of Session - 01/04/25 1051     Visit Number 2    Number of Visits 8    Date for Recertification  02/24/25    Authorization Type Generic Commercial    Authorization Time Period no auth req    PT Start Time 1015    PT Stop Time 1055    PT Time Calculation (min) 40 min    Activity Tolerance Patient tolerated treatment well;No increased pain    Behavior During Therapy [facility] for tasks assessed/performed          Past Medical History:  Diagnosis Date   Allergy    Asthma    Colon polyps    colonoscopy 05/23/16  in [place] fever    Hyperlipidemia    Pt follows w/ PCP Dr. Clotilda Single, MD, [facility] 07/17/22.   Migraines    just treats with Tylenol  OTC,  maybe one or two month   Peptic ulcer    diagnosed 1989, last issue with peptic ulcer around 2001 per pt on 8/18//23   PONV (postoperative nausea and vomiting)    hx of severe nausea & vomiting after surgery   Prediabetes    07/17/22 Hgb A1C 6.1   Uterine fibroid 2023   Wears glasses    Past Surgical History:  Procedure Laterality Date   ABDOMINAL HYSTERECTOMY     CYSTOSCOPY N/A 08/29/2022   Procedure: CYSTOSCOPY;  Surgeon: Danielle Rom, MD;  Location: [facility];  Service: Gynecology;  Laterality: N/A;   LAPAROSCOPIC LYSIS OF ADHESIONS  08/29/2022   Procedure: LAPAROSCOPIC LYSIS OF ADHESIONS;  Surgeon: Danielle Rom, MD;  Location: [facility];  Service: Gynecology;;   LAPAROSCOPIC VAGINAL HYSTERECTOMY WITH SALPINGECTOMY Bilateral 08/29/2022   Procedure: LAPAROSCOPIC ASSISTED VAGINAL HYSTERECTOMY WITH SALPINGECTOMY;  Surgeon: Danielle Rom, MD;  Location: [facility];  Service: Gynecology;  Laterality: Bilateral;   NOVASURE ABLATION  2008   THYROIDECTOMY, PARTIAL  2010    TUBAL LIGATION     Patient Active Problem List   Diagnosis Date Noted   Menopausal hyperhidrosis 01/15/2024   Snoring 01/15/2024   Witnessed episode of apnea 01/15/2024   S/P laparoscopic assisted vaginal hysterectomy (LAVH) 08/29/2022   Fibroids 02/22/2022   Hyperlipidemia 02/03/2019   Seasonal allergies 02/03/2019   History of migraine 02/03/2019   History of asthma 02/03/2019   Menopause 03/10/2013   PCP: Single Clotilda SAUNDERS, MD  REFERRING PROVIDER: Danielle Rom, MD   REFERRING DIAG: N39.3 (ICD-10-CM) - Stress incontinence (female) (female)  THERAPY DIAG:  Muscle weakness (generalized)  Unspecified lack of coordination  Abnormal posture  Rationale for Evaluation and Treatment: Rehabilitation  ONSET DATE: 4 years   SUBJECTIVE:  SUBJECTIVE STATEMENT: Patient reports that she is doing well today. 0/10 pain to report. No leakage to report since Thursday. Bowel movements have been good otherwise - had to splint with a day she has less water intake. No vaginal heaviness today.   From eval: Patient reports to PFPT with urinary leakage and some vaginal wall laxity/pressure that has been causing her to need to splint in the perineal body when passing bowel movements. Her OBGYN recommended pelvic PT. This pressure feels like heaviness and it is intermittent in nature.  Fluid intake: moderate water intake depending on the day; coffee 20 oz in the morning; sometimes a hot tea   FUNCTIONAL LIMITATIONS: feeling like she needs to have a bathroom nearby   PERTINENT HISTORY:  Medications for current condition: none  Surgeries: hysterectomy 2023; tubal ligation '93 Other: N/A Sexual abuse: No  PAIN:  Are you having pain? No - just pressure  NPRS scale: 0/10  PRECAUTIONS: None  RED  FLAGS: None   WEIGHT BEARING RESTRICTIONS: No  FALLS:  Has patient fallen in last 6 months? 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   ACTIVITY LEVEL : low; wishes to increase her activity level   PLOF: Independent  PATIENT GOALS: strengthen the vaginal canal so the pressure is less; improve urinary control  BOWEL MOVEMENT: Pain with bowel movement: No Type of bowel movement:Type (Bristol Stool Scale) 2,3,4, Frequency 1x/day, Strain depends on what time of day it is, and Splinting yes in the perineal space  Fully empty rectum: No Leakage: No                                                  Caused by: no Bowel urgency: no Pads: No Fiber supplement/laxative No  URINATION: Pain with urination: No Fully empty bladder: Yes: but could go back shortly                                          Post-void dribble: No Stream: Strong Urgency: Yes  Frequency:during the day within normal limits                                                         Nocturia: Yes: at least one time per night   Leakage: Urge to void and Walking to the bathroom Pads/briefs: No  INTERCOURSE:  Ability to have vaginal penetration Yes  Pain with intercourse: During Penetration Dryness: No Climax: yes  Marinoff Scale: 2/3 Lubricant: no   PREGNANCY: Vaginal deliveries 2 Tearing Yes: with the first delivery  Episiotomy No C-section deliveries 0 Currently pregnant No  PROLAPSE: Pressure: notices this more when she is on her feet   OBJECTIVE:  Note: Objective measures were completed at Evaluation unless otherwise noted.  PATIENT SURVEYS:  PFIQ-7: 45  COGNITION: Overall cognitive status: Within functional limits for tasks assessed     SENSATION: Light touch: Appears intact  LUMBAR SPECIAL TESTS:  Straight leg raise test: Positive  FUNCTIONAL TESTS:  Single leg stance:  Rt: +  Lt: +  Sit-up test: 1/3 Squat: within normal limits  Bed  mobility: within functional limits   GAIT: Assistive device utilized: None Comments: mild trendelenburg gait pattern with ambulation   POSTURE: rounded shoulders  LUMBARAROM/PROM: within normal limits bilaterally with no pain   LOWER EXTREMITY ROM: within normal limits bilaterally with no pain   LOWER EXTREMITY MMT:  MMT Right eval Left eval  Hip flexion 4 5  Hip extension    Hip abduction 5 5  Hip adduction 5 5  Hip internal rotation    Hip external rotation    Knee flexion 4 4  Knee extension 5 5  Ankle dorsiflexion    Ankle plantarflexion    Ankle inversion    Ankle eversion     (Blank rows = not tested) PALPATION:  General: no tenderness to palpation of bilateral adductors or pubic bone   Pelvic Alignment: within normal limits; slight anterior pelvic tilt at rest   Abdominal: abdominal bracing at rest  Diastasis: No Distortion: No  Breathing: apical breathing pattern with decreased lower rib excursion during inhalation  Scar tissue: Yes:   Active Straight Leg Raise: +                 External Perineal Exam: scar tissue present in perineal space that is rigid and adhered but not painful; mild dryness present with sufficient clitoral hood mobility                              Internal Pelvic Floor: Patient demonstrates deep pelvic floor muscle tension in bilateral obturators that is not painful with palpation. She has a moderate strength pelvic floor contraction that is not coordinated with her diaphragm. She demonstrates posterior vaginal wall laxity with cough test in hooklying.   Patient confirms identification and approves PT to assess internal pelvic floor and treatment Yes No emotional/communication barriers or cognitive limitation. Patient is motivated to learn. Patient understands and agrees with treatment goals and plan. PT explains patient will be examined in standing, sitting, and lying down to see how their muscles and joints work. When they are ready,  they will be asked to remove their underwear so PT can examine their perineum. The patient is also given the option of providing their own chaperone as one is not provided in our facility. The patient also has the right and is explained the right to defer or refuse any part of the evaluation or treatment including the internal exam. With the patient's consent, PT will use one gloved finger to gently assess the muscles of the pelvic floor, seeing how well it contracts and relaxes and if there is muscle symmetry. After, the patient will get dressed and PT and patient will discuss exam findings and plan of care. PT and patient discuss plan of care, schedule, attendance policy and HEP activities. All internal or external pelvic floor assessments and/or treatments are completed with proper hand hygiene and gloves hands. If needed gloves are changed with hand hygiene during patient care time.  PELVIC MMT: MMT eval  Vaginal 3/5, 10 quick flicks, 3 sec hold  Internal Anal Sphincter   External Anal Sphincter   Puborectalis   (Blank rows = not tested)       TONE: High tone in deep pelvic floor musculature bilaterally; low tone superficially   PROLAPSE: Posterior vaginal wall laxity present in hooklying with cough test   TODAY'S TREATMENT:  DATE:   EVAL 12/30/24: Examination completed, findings reviewed, pt educated on POC, HEP, and self care. Pt motivated to participate in PT and agreeable to attempt recommendations.   Hooklying pelvic floor contraction + diaphragmatic breathing 2x10  Hooklying quick flick contractions + diaphragmatic breathing 2x10  - Get To Know Your Pelvic Floor- Female - Urinary Incontinence - High-Fiber Diet to Support Pelvic Health - Lifestyle Changes that Support Urinary Health  01/04/25: Seated pelvic floor contraction + diaphragmatic breathing x81min   Seated quick flick contractions + diaphragmatic breathing x15 Seated adductor ball squeeze + transverse abdominis contraction + diaphragmatic breathing 2x10  Sidelying clamshell + diaphragmatic breathing 2x10  Sidelying reverse clamshell + diaphragmatic breathing 2x10  Bridge + adductor ball squeeze + diaphragmatic breathing 2x10  Toileting mechanics for optimal emptying of bladder and bowels  Double voiding technique  Blow as you go  [employer] potty discussion   PATIENT EDUCATION:  Education details: - Get To Know Your Pelvic Floor- Female - Urinary Incontinence - High-Fiber Diet to Support Pelvic Health - Lifestyle Changes that Support Urinary Health Person educated: Patient Education method: Explanation, Demonstration, Tactile cues, Verbal cues, and Handouts Education comprehension: verbalized understanding, returned demonstration, verbal cues required, tactile cues required, and needs further education  HOME EXERCISE PROGRAM: Access Code: [email or web address] URL: [email or web address] Date: 01/04/2025 Prepared by: Celena Domino  Exercises - Supine Pelvic Floor Contraction  - 1 x daily - 7 x weekly - 1 sets - 10 reps - Seated Pelvic Floor Contraction  - 1 x daily - 7 x weekly - 2 sets - 10 reps - Seated Quick Flick Pelvic Floor Contractions  - 1 x daily - 7 x weekly - 2 sets - 15 reps - Seated Hip Adduction Isometrics with Ball  - 1 x daily - 7 x weekly - 2 sets - 10 reps - Supine Bridge with Mini Swiss Ball Between Knees  - 1 x daily - 7 x weekly - 2 sets - 10 reps - Clamshell  - 1 x daily - 7 x weekly - 2 sets - 10 reps - Sidelying Reverse Clamshell  - 1 x daily - 7 x weekly - 2 sets - 10 reps  ASSESSMENT:  CLINICAL IMPRESSION: Patient is a 59 y.o. female  who was seen today for physical therapy treatment for urinary leakage and vaginal wall laxity. No incontinence since last visit, but she has had to splint with passing bowel movements over the past week. Pt tolerated  progression of pelvic floor training to seated position today with no increase in heaviness, but reports her contractions feel weaker in the seated position. She required moderate cueing for diaphragmatic breathing during today's strength training exercises. Overall, pt tolerated session well and Pt would benefit from additional PT to further address urinary leakage, pelvic floor weakness, and vaginal wall laxity.    OBJECTIVE IMPAIRMENTS: decreased coordination, decreased endurance, decreased mobility, decreased ROM, decreased strength, impaired flexibility, and pain.   ACTIVITY LIMITATIONS: standing, continence, toileting, and being on feet for long periods of time   PARTICIPATION LIMITATIONS: interpersonal relationship, community activity, and occupation  PERSONAL FACTORS: Age, Past/current experiences, and Time since onset of injury/illness/exacerbation are also affecting patient's functional outcome.   REHAB POTENTIAL: Good  CLINICAL DECISION MAKING: Stable/uncomplicated  EVALUATION COMPLEXITY: Low   GOALS: Goals reviewed with patient? Yes  SHORT TERM GOALS: Target date: 01/27/2025  Pt will be independent with HEP.  Baseline: Goal status: INITIAL  2.  Pt will be independent with diaphragmatic breathing and down  training activities in order to improve pelvic floor relaxation. Baseline:  Goal status: INITIAL  3.  Pt will be independent with the knack, urge suppression technique, and double voiding in order to improve bladder habits and decrease urinary incontinence.  Baseline:  Goal status: INITIAL  4.  Pt will be independent with use of squatty potty, relaxed toileting mechanics, and improved bowel movement techniques in order to increase ease of bowel movements and complete evacuation.  Baseline:  Goal status: INITIAL  LONG TERM GOALS: Target date: 06/29/2025  Pt will be independent with advanced HEP.  Baseline:  Goal status: INITIAL  2.  Pt to demonstrate improved  coordination of pelvic floor and breathing mechanics with 10# squat with appropriate synergistic patterns to decrease pain and leakage at least 75% of the time for improved ability to complete a 30 minute workout with strain at pelvic floor and symptoms.   Baseline:  Goal status: INITIAL  3.  Pt will be able to correctly perform diaphragmatic breathing and appropriate pressure management in order to prevent worsening vaginal wall laxity and improve pelvic floor A/ROM.  Baseline:  Goal status: INITIAL  4.  Pt will demonstrate normal pelvic floor muscle tone and A/ROM, able to achieve 4/5 strength with contractions and 10 sec endurance, in order to reduce urinary leaking and number of pads patient wears.   Baseline:  Goal status: INITIAL  PLAN:  PT FREQUENCY: 1-2x/week  PT DURATION: 6 months  PLANNED INTERVENTIONS: 97110-Therapeutic exercises, 97530- Therapeutic activity, 97112- Neuromuscular re-education, 97535- Self Care, [id number]- Manual therapy, Patient/Family education, Taping, Joint mobilization, Spinal mobilization, Scar mobilization, Cryotherapy, Moist heat, and Biofeedback  PLAN FOR NEXT SESSION: continued pelvic floor trianing in seated position; introduce toileting mechanics; discuss pressure management for prolapse; decreasing fluid intake 2 hours before bedtime; core and hip strengthening  Celena JAYSON Domino, PT 01/04/2025, 10:52 AM [facility] [address] [place], [place] [address] Phone # [phone] Fax [phone]  "

## 2025-01-05 ENCOUNTER — Ambulatory Visit: Payer: Self-pay | Admitting: Family Medicine

## 2025-01-05 DIAGNOSIS — D751 Secondary polycythemia: Secondary | ICD-10-CM

## 2025-01-05 DIAGNOSIS — N179 Acute kidney failure, unspecified: Secondary | ICD-10-CM

## 2025-01-05 DIAGNOSIS — E038 Other specified hypothyroidism: Secondary | ICD-10-CM

## 2025-01-05 NOTE — Progress Notes (Signed)
 "  Established Patient Office Visit   Subjective  Patient ID: Laura Rice, female    DOB: 1966/01/29  Age: 59 y.o. MRN: [id number]  Chief Complaint  Patient presents with   Annual Exam    Patient is a 59 year old female seen for CPE.  Patient is not fasting.  Patient states Veozah was discontinued as still having hot flashes.  Started on lynuet 60 mg by OB/GYN.  Also notes starting pelvic floor physical therapy.  Patient endorses pain, numbness, tingling ball of R foot after prolonged walking.  The pain will then go up midfoot and ankle.  Patient used to dance and wear high heels daily to work prior to [employer] pandemic.  Patient has also noticed intermittent pruritic rash on bilateral sides of abdomen.  Thinks may be from increased sweating.  Denies rash underneath bilateral breasts.  Symptoms improved with application of powder and cream.    Patient Active Problem List   Diagnosis Date Noted   Menopausal hyperhidrosis 01/15/2024   Snoring 01/15/2024   Witnessed episode of apnea 01/15/2024   S/P laparoscopic assisted vaginal hysterectomy (LAVH) 08/29/2022   Fibroids 02/22/2022   Hyperlipidemia 02/03/2019   Seasonal allergies 02/03/2019   History of migraine 02/03/2019   History of asthma 02/03/2019   Menopause 03/10/2013   Past Medical History:  Diagnosis Date   Allergy    Asthma    Colon polyps    colonoscopy 05/23/16  in [place] fever    Hyperlipidemia    Pt follows w/ PCP Dr. Clotilda Single, MD, [facility] 07/17/22.   Migraines    just treats with Tylenol  OTC,  maybe one or two month   Peptic ulcer    diagnosed 1989, last issue with peptic ulcer around 2001 per pt on 8/18//23   PONV (postoperative nausea and vomiting)    hx of severe nausea & vomiting after surgery   Prediabetes    07/17/22 Hgb A1C 6.1   Uterine fibroid 2023   Wears glasses    Past Surgical History:  Procedure Laterality Date   ABDOMINAL HYSTERECTOMY     CYSTOSCOPY N/A 08/29/2022   Procedure:  CYSTOSCOPY;  Surgeon: Danielle Rom, MD;  Location: [facility];  Service: Gynecology;  Laterality: N/A;   LAPAROSCOPIC LYSIS OF ADHESIONS  08/29/2022   Procedure: LAPAROSCOPIC LYSIS OF ADHESIONS;  Surgeon: Danielle Rom, MD;  Location: [facility];  Service: Gynecology;;   LAPAROSCOPIC VAGINAL HYSTERECTOMY WITH SALPINGECTOMY Bilateral 08/29/2022   Procedure: LAPAROSCOPIC ASSISTED VAGINAL HYSTERECTOMY WITH SALPINGECTOMY;  Surgeon: Danielle Rom, MD;  Location: [facility];  Service: Gynecology;  Laterality: Bilateral;   NOVASURE ABLATION  2008   THYROIDECTOMY, PARTIAL  2010   TUBAL LIGATION     Social History[1] Family History  Problem Relation Age of Onset   Alcohol abuse Brother    Diabetes Brother    Early death Brother    Arthritis Maternal Grandmother    Diabetes Maternal Grandmother    Arthritis Maternal Grandfather    Asthma Paternal Grandmother    Early death Paternal Grandmother    Alcohol abuse Daughter    Drug abuse Daughter    Asthma Son    Arthritis Mother    Cancer Mother    Hyperlipidemia Mother    Hypertension Mother    Kidney disease Mother    Arthritis Father    Cancer Father    Hearing loss Father    Hyperlipidemia Father    Hypertension Father  Stroke Father    Breast cancer Neg Hx    Sleep apnea Neg Hx    Allergies[2]  ROS Negative unless stated above    Objective:     BP 124/82 (BP Location: Left Arm, Patient Position: Sitting, Cuff Size: Large)   Pulse 98   Temp 98.8 F (37.1 C) (Oral)   Ht 5' 3 (1.6 m)   Wt 178 lb (80.7 kg)   SpO2 100%   BMI 31.53 kg/m  BP Readings from Last 3 Encounters:  12/31/24 124/82  07/29/24 132/80  01/22/24 122/70   Wt Readings from Last 3 Encounters:  12/31/24 178 lb (80.7 kg)  07/29/24 182 lb 9.6 oz (82.8 kg)  01/22/24 184 lb 6.4 oz (83.6 kg)      Physical Exam Constitutional:      Appearance: Normal appearance.  HENT:      Head: Normocephalic and atraumatic.     Right Ear: Tympanic membrane, ear canal and external ear normal.     Left Ear: Tympanic membrane, ear canal and external ear normal.     Nose: Nose normal.     Mouth/Throat:     Mouth: Mucous membranes are moist.     Pharynx: No oropharyngeal exudate or posterior oropharyngeal erythema.  Eyes:     General: No scleral icterus.    Extraocular Movements: Extraocular movements intact.     Conjunctiva/sclera: Conjunctivae normal.     Pupils: Pupils are equal, round, and reactive to light.  Neck:     Thyroid : No thyromegaly.     Vascular: No carotid bruit.  Cardiovascular:     Rate and Rhythm: Normal rate and regular rhythm.     Pulses: Normal pulses.     Heart sounds: Normal heart sounds. No murmur heard.    No friction rub.  Pulmonary:     Effort: Pulmonary effort is normal.     Breath sounds: Normal breath sounds. No wheezing, rhonchi or rales.  Abdominal:     General: Bowel sounds are normal.     Palpations: Abdomen is soft.     Tenderness: There is no abdominal tenderness.  Musculoskeletal:        General: No deformity. Normal range of motion.     Comments: B/l bunions.    Lymphadenopathy:     Cervical: No cervical adenopathy.  Skin:    General: Skin is warm and dry.     Findings: No lesion.  Neurological:     General: No focal deficit present.     Mental Status: She is alert and oriented to person, place, and time.  Psychiatric:        Mood and Affect: Mood normal.        Thought Content: Thought content normal.        12/31/2024    2:17 PM 07/29/2024    2:15 PM 12/04/2023    8:41 AM  Depression screen PHQ 2/9  Decreased Interest 0 0 0  Down, Depressed, Hopeless 0 0 0  PHQ - 2 Score 0 0 0  Altered sleeping 0 0 3  Tired, decreased energy 1 0 3  Change in appetite 0 0 0  Feeling bad or failure about yourself  0 0 0  Trouble concentrating 0 0 0  Moving slowly or fidgety/restless 0 0 0  Suicidal thoughts 0 0 0  PHQ-9 Score 1  0  6   Difficult doing work/chores Not difficult at all  Not difficult at all     Data saved with  a previous flowsheet row definition      12/31/2024    2:17 PM 07/29/2024    2:15 PM 12/04/2023    8:41 AM 04/07/2023    4:48 PM  GAD 7 : Generalized Anxiety Score  Nervous, Anxious, on Edge 0 0 0 0  Control/stop worrying 0 0 0 0  Worry too much - different things 0 0 0 0  Trouble relaxing 0 0 0 0  Restless 0 0 0 0  Easily annoyed or irritable 0 0 0 0  Afraid - awful might happen 0 0 0 0  Total GAD 7 Score 0 0 0 0  Anxiety Difficulty   Not difficult at all Not difficult at all     No results found for any visits on 12/31/24.    Assessment & Plan:   Well adult exam -     CBC with Differential/Platelet; Future -     Comprehensive metabolic panel with GFR; Future -     Hemoglobin A1c; Future -     Lipid panel; Future -     T4, free; Future -     TSH; Future  Intertrigo  Bilateral bunions  Right foot pain  Hot flashes due to menopause  Numbness and tingling of foot -     Vitamin B12; Future  Age-appropriate health screenings discussed.  Obtain labs.  Immunizations reviewed.  Mammogram due as last done 09/01/2023.  Colonoscopy last done January or May 2017 in [place].  10-year recall, due 2027.  Pap not indicated 2/2 lab hysterectomy with salpingectomy in 2023.  Increased hot flashes due to menopause.  Veozah DC'd.  To start Lynkuet 60 mg.  Continue follow-up with OB/GYN.  Intertrigo on bilateral sides of abdomen likely from diaphoresis from increased hot flashes.  Encouraged to keep skin clean and dry.  Nystatin as needed.  Moisture wicking fabrics.  Right foot pain and paresthesias likely 2/2 bunions.  Also consider Morton's neuroma.  Advised to wear supportive shoes, switch out insoles, avoid heels.  Discussed additional treatment options including steroid injections and surgery.  For continued symptoms refer to podiatry.  Return if symptoms worsen or fail to improve.   Clotilda JONELLE Single, MD    [1]  Social History Tobacco Use   Smoking status: Never   Smokeless tobacco: Never  Vaping Use   Vaping status: Never Used  Substance Use Topics   Alcohol use: Yes    Comment: about 2 or 3 glasses of wine or mixed drinks per month   Drug use: Never  [2] No Known Allergies  "

## 2025-01-12 ENCOUNTER — Ambulatory Visit: Payer: PRIVATE HEALTH INSURANCE | Admitting: Physical Therapy

## 2025-01-12 DIAGNOSIS — N393 Stress incontinence (female) (male): Secondary | ICD-10-CM | POA: Diagnosis not present

## 2025-01-12 DIAGNOSIS — R293 Abnormal posture: Secondary | ICD-10-CM

## 2025-01-12 DIAGNOSIS — R102 Pelvic and perineal pain unspecified side: Secondary | ICD-10-CM

## 2025-01-12 DIAGNOSIS — M6281 Muscle weakness (generalized): Secondary | ICD-10-CM

## 2025-01-12 DIAGNOSIS — R279 Unspecified lack of coordination: Secondary | ICD-10-CM

## 2025-01-12 NOTE — Therapy (Signed)
 " OUTPATIENT PHYSICAL THERAPY FEMALE PELVIC TREATMENT   Patient Name: Laura Rice MRN: [id number] DOB:[birth year], 59 y.o., female Today's Date: 01/12/2025  END OF SESSION:  PT End of Session - 01/12/25 1115     Visit Number 3    Number of Visits 8    Date for Recertification  02/24/25    Authorization Type Generic Commercial    Authorization Time Period no auth req    PT Start Time 1100    PT Stop Time 1145    PT Time Calculation (min) 45 min    Activity Tolerance Patient tolerated treatment well;No increased pain    Behavior During Therapy [facility] for tasks assessed/performed           Past Medical History:  Diagnosis Date   Allergy    Asthma    Colon polyps    colonoscopy 05/23/16  in [place] fever    Hyperlipidemia    Pt follows w/ PCP Dr. Clotilda Single, MD, [place] 07/17/22.   Migraines    just treats with Tylenol  OTC,  maybe one or two month   Peptic ulcer    diagnosed 1989, last issue with peptic ulcer around 2001 per pt on 8/18//23   PONV (postoperative nausea and vomiting)    hx of severe nausea & vomiting after surgery   Prediabetes    07/17/22 Hgb A1C 6.1   Uterine fibroid 2023   Wears glasses    Past Surgical History:  Procedure Laterality Date   ABDOMINAL HYSTERECTOMY     CYSTOSCOPY N/A 08/29/2022   Procedure: CYSTOSCOPY;  Surgeon: Danielle Rom, MD;  Location: [facility];  Service: Gynecology;  Laterality: N/A;   LAPAROSCOPIC LYSIS OF ADHESIONS  08/29/2022   Procedure: LAPAROSCOPIC LYSIS OF ADHESIONS;  Surgeon: Danielle Rom, MD;  Location: [facility];  Service: Gynecology;;   LAPAROSCOPIC VAGINAL HYSTERECTOMY WITH SALPINGECTOMY Bilateral 08/29/2022   Procedure: LAPAROSCOPIC ASSISTED VAGINAL HYSTERECTOMY WITH SALPINGECTOMY;  Surgeon: Danielle Rom, MD;  Location:  SURGERY CENTER;  Service: Gynecology;  Laterality: Bilateral;   NOVASURE ABLATION  2008   THYROIDECTOMY, PARTIAL  2010    TUBAL LIGATION     Patient Active Problem List   Diagnosis Date Noted   Menopausal hyperhidrosis 01/15/2024   Snoring 01/15/2024   Witnessed episode of apnea 01/15/2024   S/P laparoscopic assisted vaginal hysterectomy (LAVH) 08/29/2022   Fibroids 02/22/2022   Hyperlipidemia 02/03/2019   Seasonal allergies 02/03/2019   History of migraine 02/03/2019   History of asthma 02/03/2019   Menopause 03/10/2013   PCP: Single Clotilda SAUNDERS, MD  REFERRING PROVIDER: Danielle Rom, MD   REFERRING DIAG: N39.3 (ICD-10-CM) - Stress incontinence (female) (female)  THERAPY DIAG:  Abnormal posture  Muscle weakness (generalized)  Pelvic pain  Unspecified lack of coordination  Rationale for Evaluation and Treatment: Rehabilitation  ONSET DATE: 4 years   SUBJECTIVE:  SUBJECTIVE STATEMENT: Patient reports that she is doing well today, 0/10 pain to report. No leakage since last visit. She has been having more regular bowel movements. She has increased her water intake slowly.   From eval: Patient reports to PFPT with urinary leakage and some vaginal wall laxity/pressure that has been causing her to need to splint in the perineal body when passing bowel movements. Her OBGYN recommended pelvic PT. This pressure feels like heaviness and it is intermittent in nature.  Fluid intake: moderate water intake depending on the day; coffee 20 oz in the morning; sometimes a hot tea   FUNCTIONAL LIMITATIONS: feeling like she needs to have a bathroom nearby   PERTINENT HISTORY:  Medications for current condition: none  Surgeries: hysterectomy 2023; tubal ligation '93 Other: N/A Sexual abuse: No  PAIN:  Are you having pain? No - just pressure  NPRS scale: 0/10  PRECAUTIONS: None  RED FLAGS: None   WEIGHT BEARING  RESTRICTIONS: No  FALLS:  Has patient fallen in last 6 months? 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   ACTIVITY LEVEL : low; wishes to increase her activity level   PLOF: Independent  PATIENT GOALS: strengthen the vaginal canal so the pressure is less; improve urinary control  BOWEL MOVEMENT: Pain with bowel movement: No Type of bowel movement:Type (Bristol Stool Scale) 2,3,4, Frequency 1x/day, Strain depends on what time of day it is, and Splinting yes in the perineal space  Fully empty rectum: No Leakage: No                                                  Caused by: no Bowel urgency: no Pads: No Fiber supplement/laxative No  URINATION: Pain with urination: No Fully empty bladder: Yes: but could go back shortly                                          Post-void dribble: No Stream: Strong Urgency: Yes  Frequency:during the day within normal limits                                                         Nocturia: Yes: at least one time per night   Leakage: Urge to void and Walking to the bathroom Pads/briefs: No  INTERCOURSE:  Ability to have vaginal penetration Yes  Pain with intercourse: During Penetration Dryness: No Climax: yes  Marinoff Scale: 2/3 Lubricant: no   PREGNANCY: Vaginal deliveries 2 Tearing Yes: with the first delivery  Episiotomy No C-section deliveries 0 Currently pregnant No  PROLAPSE: Pressure: notices this more when she is on her feet   OBJECTIVE:  Note: Objective measures were completed at Evaluation unless otherwise noted.  PATIENT SURVEYS:  PFIQ-7: 45  COGNITION: Overall cognitive status: Within functional limits for tasks assessed     SENSATION: Light touch: Appears intact  LUMBAR SPECIAL TESTS:  Straight leg raise test: Positive  FUNCTIONAL TESTS:  Single leg stance:  Rt: +   Lt: +  Sit-up test: 1/3  Squat: within normal limits  Bed mobility: within functional  limits   GAIT: Assistive device utilized: None Comments: mild trendelenburg gait pattern with ambulation   POSTURE: rounded shoulders  LUMBARAROM/PROM: within normal limits bilaterally with no pain   LOWER EXTREMITY ROM: within normal limits bilaterally with no pain   LOWER EXTREMITY MMT:  MMT Right eval Left eval  Hip flexion 4 5  Hip extension    Hip abduction 5 5  Hip adduction 5 5  Hip internal rotation    Hip external rotation    Knee flexion 4 4  Knee extension 5 5  Ankle dorsiflexion    Ankle plantarflexion    Ankle inversion    Ankle eversion     (Blank rows = not tested) PALPATION:  General: no tenderness to palpation of bilateral adductors or pubic bone   Pelvic Alignment: within normal limits; slight anterior pelvic tilt at rest   Abdominal: abdominal bracing at rest  Diastasis: No Distortion: No  Breathing: apical breathing pattern with decreased lower rib excursion during inhalation  Scar tissue: Yes:   Active Straight Leg Raise: +                 External Perineal Exam: scar tissue present in perineal space that is rigid and adhered but not painful; mild dryness present with sufficient clitoral hood mobility                              Internal Pelvic Floor: Patient demonstrates deep pelvic floor muscle tension in bilateral obturators that is not painful with palpation. She has a moderate strength pelvic floor contraction that is not coordinated with her diaphragm. She demonstrates posterior vaginal wall laxity with cough test in hooklying.   Patient confirms identification and approves PT to assess internal pelvic floor and treatment Yes No emotional/communication barriers or cognitive limitation. Patient is motivated to learn. Patient understands and agrees with treatment goals and plan. PT explains patient will be examined in standing, sitting, and lying down to see how their muscles and joints work. When they are ready, they will be asked to remove  their underwear so PT can examine their perineum. The patient is also given the option of providing their own chaperone as one is not provided in our facility. The patient also has the right and is explained the right to defer or refuse any part of the evaluation or treatment including the internal exam. With the patient's consent, PT will use one gloved finger to gently assess the muscles of the pelvic floor, seeing how well it contracts and relaxes and if there is muscle symmetry. After, the patient will get dressed and PT and patient will discuss exam findings and plan of care. PT and patient discuss plan of care, schedule, attendance policy and HEP activities. All internal or external pelvic floor assessments and/or treatments are completed with proper hand hygiene and gloves hands. If needed gloves are changed with hand hygiene during patient care time.  PELVIC MMT: MMT eval  Vaginal 3/5, 10 quick flicks, 3 sec hold  Internal Anal Sphincter   External Anal Sphincter   Puborectalis   (Blank rows = not tested)       TONE: High tone in deep pelvic floor musculature bilaterally; low tone superficially   PROLAPSE: Posterior vaginal wall laxity present in hooklying with cough test   TODAY'S TREATMENT:  DATE:   EVAL 12/30/24: Examination completed, findings reviewed, pt educated on POC, HEP, and self care. Pt motivated to participate in PT and agreeable to attempt recommendations.   Hooklying pelvic floor contraction + diaphragmatic breathing 2x10  Hooklying quick flick contractions + diaphragmatic breathing 2x10  - Get To Know Your Pelvic Floor- Female - Urinary Incontinence - High-Fiber Diet to Support Pelvic Health - Lifestyle Changes that Support Urinary Health  01/04/25: Seated pelvic floor contraction + diaphragmatic breathing x59min  Seated quick flick contractions  + diaphragmatic breathing x15 Seated adductor ball squeeze + transverse abdominis contraction + diaphragmatic breathing 2x10  Sidelying clamshell + diaphragmatic breathing 2x10  Sidelying reverse clamshell + diaphragmatic breathing 2x10  Bridge + adductor ball squeeze + diaphragmatic breathing 2x10  Toileting mechanics for optimal emptying of bladder and bowels  Double voiding technique  Blow as you go  Squatty potty discussion   01/12/25: Seated pelvic floor contraction + diaphragmatic breathing x19min  Standing pelvic floor contraction + diaphragmatic breathing x3min  Seated quick flick pelvic floor contractions x20  1 minute: 5 adductor ball squeeze, 5 gluteal squeezes, 5 kegels - focusing on differentiation of the muscle groups  Seated adductor ball squeeze + internal rotation + diaphragmatic breathing 2x10 each side  Seated hip abduction + GTB + diaphragmatic breathing 2x12  Sit to stand + diaphragmatic breathing 2x10   PATIENT EDUCATION:  Education details: - Get To Know Your Pelvic Floor- Female - Urinary Incontinence - High-Fiber Diet to Support Pelvic Health - Lifestyle Changes that Support Urinary Health Person educated: Patient Education method: Explanation, Demonstration, Tactile cues, Verbal cues, and Handouts Education comprehension: verbalized understanding, returned demonstration, verbal cues required, tactile cues required, and needs further education  HOME EXERCISE PROGRAM: Access Code: [email or web address] URL: [email or web address] Date: 01/04/2025 Prepared by: Celena Domino  Exercises - Supine Pelvic Floor Contraction  - 1 x daily - 7 x weekly - 1 sets - 10 reps - Seated Pelvic Floor Contraction  - 1 x daily - 7 x weekly - 2 sets - 10 reps - Seated Quick Flick Pelvic Floor Contractions  - 1 x daily - 7 x weekly - 2 sets - 15 reps - Seated Hip Adduction Isometrics with Ball  - 1 x daily - 7 x weekly - 2 sets - 10 reps - Supine Bridge with Mini Swiss Ball  Between Knees  - 1 x daily - 7 x weekly - 2 sets - 10 reps - Clamshell  - 1 x daily - 7 x weekly - 2 sets - 10 reps - Sidelying Reverse Clamshell  - 1 x daily - 7 x weekly - 2 sets - 10 reps  ASSESSMENT:  CLINICAL IMPRESSION: Patient is a 59 y.o. female  who was seen today for physical therapy treatment for urinary leakage and vaginal wall laxity. No incontinence since last visit and bowel movements have been regular. Pt tolerated progression of pelvic floor training to standing position today with only slight difficulty. She still fatigues after 15 repetitions of quick flick pelvic floor contractions. She tolerated gentle hip strengthening progressions with little to no difficulty today. Overall, pt tolerated session well and Pt would benefit from additional PT to further address urinary leakage, pelvic floor weakness, and vaginal wall laxity.    OBJECTIVE IMPAIRMENTS: decreased coordination, decreased endurance, decreased mobility, decreased ROM, decreased strength, impaired flexibility, and pain.   ACTIVITY LIMITATIONS: standing, continence, toileting, and being on feet for long periods of time   PARTICIPATION LIMITATIONS: interpersonal relationship, community activity,  and occupation  PERSONAL FACTORS: Age, Past/current experiences, and Time since onset of injury/illness/exacerbation are also affecting patient's functional outcome.   REHAB POTENTIAL: Good  CLINICAL DECISION MAKING: Stable/uncomplicated  EVALUATION COMPLEXITY: Low   GOALS: Goals reviewed with patient? Yes  SHORT TERM GOALS: Target date: 01/27/2025  Pt will be independent with HEP.  Baseline: Goal status: INITIAL  2.  Pt will be independent with diaphragmatic breathing and down training activities in order to improve pelvic floor relaxation. Baseline:  Goal status: INITIAL  3.  Pt will be independent with the knack, urge suppression technique, and double voiding in order to improve bladder habits and decrease  urinary incontinence.  Baseline:  Goal status: INITIAL  4.  Pt will be independent with use of squatty potty, relaxed toileting mechanics, and improved bowel movement techniques in order to increase ease of bowel movements and complete evacuation.  Baseline:  Goal status: INITIAL  LONG TERM GOALS: Target date: 06/29/2025  Pt will be independent with advanced HEP.  Baseline:  Goal status: INITIAL  2.  Pt to demonstrate improved coordination of pelvic floor and breathing mechanics with 10# squat with appropriate synergistic patterns to decrease pain and leakage at least 75% of the time for improved ability to complete a 30 minute workout with strain at pelvic floor and symptoms.   Baseline:  Goal status: INITIAL  3.  Pt will be able to correctly perform diaphragmatic breathing and appropriate pressure management in order to prevent worsening vaginal wall laxity and improve pelvic floor A/ROM.  Baseline:  Goal status: INITIAL  4.  Pt will demonstrate normal pelvic floor muscle tone and A/ROM, able to achieve 4/5 strength with contractions and 10 sec endurance, in order to reduce urinary leaking and number of pads patient wears.   Baseline:  Goal status: INITIAL  PLAN:  PT FREQUENCY: 1-2x/week  PT DURATION: 6 months  PLANNED INTERVENTIONS: 97110-Therapeutic exercises, 97530- Therapeutic activity, 97112- Neuromuscular re-education, 97535- Self Care, [id number]- Manual therapy, Patient/Family education, Taping, Joint mobilization, Spinal mobilization, Scar mobilization, Cryotherapy, Moist heat, and Biofeedback  PLAN FOR NEXT SESSION: continued pelvic floor trianing in seated position; introduce toileting mechanics; discuss pressure management for prolapse; decreasing fluid intake 2 hours before bedtime; core and hip strengthening  Celena JAYSON Domino, PT 01/12/2025, 11:17 AM [facility] [address] [place], [place] [address] Phone # [phone] Fax  [phone]  "

## 2025-01-13 ENCOUNTER — Other Ambulatory Visit: Payer: PRIVATE HEALTH INSURANCE

## 2025-01-13 DIAGNOSIS — N179 Acute kidney failure, unspecified: Secondary | ICD-10-CM

## 2025-01-13 DIAGNOSIS — E038 Other specified hypothyroidism: Secondary | ICD-10-CM

## 2025-01-13 DIAGNOSIS — D751 Secondary polycythemia: Secondary | ICD-10-CM

## 2025-01-13 LAB — CBC WITH DIFFERENTIAL/PLATELET
Basophils Absolute: 0 K/uL (ref 0.0–0.1)
Basophils Relative: 1.1 % (ref 0.0–3.0)
Eosinophils Absolute: 0.2 K/uL (ref 0.0–0.7)
Eosinophils Relative: 4.2 % (ref 0.0–5.0)
HCT: 44.4 % (ref 36.0–46.0)
Hemoglobin: 14.7 g/dL (ref 12.0–15.0)
Lymphocytes Relative: 51.3 % — ABNORMAL HIGH (ref 12.0–46.0)
Lymphs Abs: 2 K/uL (ref 0.7–4.0)
MCHC: 33 g/dL (ref 30.0–36.0)
MCV: 84.8 fl (ref 78.0–100.0)
Monocytes Absolute: 0.3 K/uL (ref 0.1–1.0)
Monocytes Relative: 8.7 % (ref 3.0–12.0)
Neutro Abs: 1.3 K/uL — ABNORMAL LOW (ref 1.4–7.7)
Neutrophils Relative %: 34.7 % — ABNORMAL LOW (ref 43.0–77.0)
Platelets: 218 K/uL (ref 150.0–400.0)
RBC: 5.24 Mil/uL — ABNORMAL HIGH (ref 3.87–5.11)
RDW: 13.8 % (ref 11.5–15.5)
WBC: 3.8 K/uL — ABNORMAL LOW (ref 4.0–10.5)

## 2025-01-13 LAB — BASIC METABOLIC PANEL WITH GFR
BUN: 12 mg/dL (ref 6–23)
CO2: 29 meq/L (ref 19–32)
Calcium: 9.6 mg/dL (ref 8.4–10.5)
Chloride: 102 meq/L (ref 96–112)
Creatinine, Ser: 1.2 mg/dL (ref 0.40–1.20)
GFR: 49.83 mL/min — ABNORMAL LOW
Glucose, Bld: 101 mg/dL — ABNORMAL HIGH (ref 70–99)
Potassium: 4.5 meq/L (ref 3.5–5.1)
Sodium: 138 meq/L (ref 135–145)

## 2025-01-13 LAB — T4, FREE: Free T4: 0.84 ng/dL (ref 0.60–1.60)

## 2025-01-13 LAB — TSH: TSH: 3.74 u[IU]/mL (ref 0.35–5.50)

## 2025-01-19 ENCOUNTER — Ambulatory Visit: Payer: Self-pay | Admitting: Physical Therapy

## 2025-01-19 DIAGNOSIS — M6281 Muscle weakness (generalized): Secondary | ICD-10-CM

## 2025-01-19 DIAGNOSIS — R102 Pelvic and perineal pain unspecified side: Secondary | ICD-10-CM

## 2025-01-19 DIAGNOSIS — N393 Stress incontinence (female) (male): Secondary | ICD-10-CM | POA: Diagnosis not present

## 2025-01-19 DIAGNOSIS — R279 Unspecified lack of coordination: Secondary | ICD-10-CM

## 2025-01-19 DIAGNOSIS — R293 Abnormal posture: Secondary | ICD-10-CM

## 2025-01-19 NOTE — Therapy (Signed)
 " OUTPATIENT PHYSICAL THERAPY FEMALE PELVIC TREATMENT   Patient Name: Laura Rice MRN: [id number] DOB:08-26-1966, 59 y.o., female Today's Date: 01/19/2025  END OF SESSION:  PT End of Session - 01/19/25 1203     Visit Number 4    Number of Visits 8    Date for Recertification  02/24/25    Authorization Type Generic Commercial    Authorization Time Period no auth req    PT Start Time 1145    PT Stop Time 1230    PT Time Calculation (min) 45 min    Activity Tolerance Patient tolerated treatment well;No increased pain    Behavior During Therapy [facility] for tasks assessed/performed            Past Medical History:  Diagnosis Date   Allergy    Asthma    Colon polyps    colonoscopy 05/23/16  in [place] fever    Hyperlipidemia    Pt follows w/ PCP Dr. Clotilda Single, MD, [place] 07/17/22.   Migraines    just treats with Tylenol  OTC,  maybe one or two month   Peptic ulcer    diagnosed 1989, last issue with peptic ulcer around 2001 per pt on 8/18//23   PONV (postoperative nausea and vomiting)    hx of severe nausea & vomiting after surgery   Prediabetes    07/17/22 Hgb A1C 6.1   Uterine fibroid 2023   Wears glasses    Past Surgical History:  Procedure Laterality Date   ABDOMINAL HYSTERECTOMY     CYSTOSCOPY N/A 08/29/2022   Procedure: CYSTOSCOPY;  Surgeon: Danielle Rom, MD;  Location: [facility];  Service: Gynecology;  Laterality: N/A;   LAPAROSCOPIC LYSIS OF ADHESIONS  08/29/2022   Procedure: LAPAROSCOPIC LYSIS OF ADHESIONS;  Surgeon: Danielle Rom, MD;  Location: [facility];  Service: Gynecology;;   LAPAROSCOPIC VAGINAL HYSTERECTOMY WITH SALPINGECTOMY Bilateral 08/29/2022   Procedure: LAPAROSCOPIC ASSISTED VAGINAL HYSTERECTOMY WITH SALPINGECTOMY;  Surgeon: Danielle Rom, MD;  Location: [facility];  Service: Gynecology;  Laterality: Bilateral;   NOVASURE ABLATION  2008   THYROIDECTOMY, PARTIAL  2010    TUBAL LIGATION     Patient Active Problem List   Diagnosis Date Noted   Menopausal hyperhidrosis 01/15/2024   Snoring 01/15/2024   Witnessed episode of apnea 01/15/2024   S/P laparoscopic assisted vaginal hysterectomy (LAVH) 08/29/2022   Fibroids 02/22/2022   Hyperlipidemia 02/03/2019   Seasonal allergies 02/03/2019   History of migraine 02/03/2019   History of asthma 02/03/2019   Menopause 03/10/2013   PCP: Single Clotilda SAUNDERS, MD  REFERRING PROVIDER: Danielle Rom, MD   REFERRING DIAG: N39.3 (ICD-10-CM) - Stress incontinence (female) (female)  THERAPY DIAG:  Abnormal posture  Muscle weakness (generalized)  Pelvic pain  Unspecified lack of coordination  Rationale for Evaluation and Treatment: Rehabilitation  ONSET DATE: 4 years   SUBJECTIVE:  SUBJECTIVE STATEMENT: Patient reports that her body's feeling good today. No pain to report. No urinary issues to report. Usually she is only getting up 1x/night to void. Bowel movements have been feeling slightly incomplete, resulting in fecal smearing at later points in the day after having first bowel movement in the morning.   From eval: Patient reports to PFPT with urinary leakage and some vaginal wall laxity/pressure that has been causing her to need to splint in the perineal body when passing bowel movements. Her OBGYN recommended pelvic PT. This pressure feels like heaviness and it is intermittent in nature.  Fluid intake: moderate water intake depending on the day; coffee 20 oz in the morning; sometimes a hot tea   FUNCTIONAL LIMITATIONS: feeling like she needs to have a bathroom nearby   PERTINENT HISTORY:  Medications for current condition: none  Surgeries: hysterectomy 2023; tubal ligation '93 Other: N/A Sexual abuse: No  PAIN:   Are you having pain? No - just pressure  NPRS scale: 0/10  PRECAUTIONS: None  RED FLAGS: None   WEIGHT BEARING RESTRICTIONS: No  FALLS:  Has patient fallen in last 6 months? 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   ACTIVITY LEVEL : low; wishes to increase her activity level   PLOF: Independent  PATIENT GOALS: strengthen the vaginal canal so the pressure is less; improve urinary control  BOWEL MOVEMENT: Pain with bowel movement: No Type of bowel movement:Type (Bristol Stool Scale) 2,3,4, Frequency 1x/day, Strain depends on what time of day it is, and Splinting yes in the perineal space  Fully empty rectum: No Leakage: No                                                  Caused by: no Bowel urgency: no Pads: No Fiber supplement/laxative No  URINATION: Pain with urination: No Fully empty bladder: Yes: but could go back shortly                                          Post-void dribble: No Stream: Strong Urgency: Yes  Frequency:during the day within normal limits                                                         Nocturia: Yes: at least one time per night   Leakage: Urge to void and Walking to the bathroom Pads/briefs: No  INTERCOURSE:  Ability to have vaginal penetration Yes  Pain with intercourse: During Penetration Dryness: No Climax: yes  Marinoff Scale: 2/3 Lubricant: no   PREGNANCY: Vaginal deliveries 2 Tearing Yes: with the first delivery  Episiotomy No C-section deliveries 0 Currently pregnant No  PROLAPSE: Pressure: notices this more when she is on her feet   OBJECTIVE:  Note: Objective measures were completed at Evaluation unless otherwise noted.  PATIENT SURVEYS:  PFIQ-7: 45  COGNITION: Overall cognitive status: Within functional limits for tasks assessed     SENSATION: Light touch: Appears intact  LUMBAR SPECIAL TESTS:  Straight leg raise test:  Positive  FUNCTIONAL TESTS:   Single leg stance:  Rt: +   Lt: +  Sit-up test: 1/3 Squat: within normal limits  Bed mobility: within functional limits   GAIT: Assistive device utilized: None Comments: mild trendelenburg gait pattern with ambulation   POSTURE: rounded shoulders  LUMBARAROM/PROM: within normal limits bilaterally with no pain   LOWER EXTREMITY ROM: within normal limits bilaterally with no pain   LOWER EXTREMITY MMT:  MMT Right eval Left eval  Hip flexion 4 5  Hip extension    Hip abduction 5 5  Hip adduction 5 5  Hip internal rotation    Hip external rotation    Knee flexion 4 4  Knee extension 5 5  Ankle dorsiflexion    Ankle plantarflexion    Ankle inversion    Ankle eversion     (Blank rows = not tested) PALPATION:  General: no tenderness to palpation of bilateral adductors or pubic bone   Pelvic Alignment: within normal limits; slight anterior pelvic tilt at rest   Abdominal: abdominal bracing at rest  Diastasis: No Distortion: No  Breathing: apical breathing pattern with decreased lower rib excursion during inhalation  Scar tissue: Yes:   Active Straight Leg Raise: +                 External Perineal Exam: scar tissue present in perineal space that is rigid and adhered but not painful; mild dryness present with sufficient clitoral hood mobility                              Internal Pelvic Floor: Patient demonstrates deep pelvic floor muscle tension in bilateral obturators that is not painful with palpation. She has a moderate strength pelvic floor contraction that is not coordinated with her diaphragm. She demonstrates posterior vaginal wall laxity with cough test in hooklying.   Patient confirms identification and approves PT to assess internal pelvic floor and treatment Yes No emotional/communication barriers or cognitive limitation. Patient is motivated to learn. Patient understands and agrees with treatment goals and plan. PT explains patient will be examined in  standing, sitting, and lying down to see how their muscles and joints work. When they are ready, they will be asked to remove their underwear so PT can examine their perineum. The patient is also given the option of providing their own chaperone as one is not provided in our facility. The patient also has the right and is explained the right to defer or refuse any part of the evaluation or treatment including the internal exam. With the patient's consent, PT will use one gloved finger to gently assess the muscles of the pelvic floor, seeing how well it contracts and relaxes and if there is muscle symmetry. After, the patient will get dressed and PT and patient will discuss exam findings and plan of care. PT and patient discuss plan of care, schedule, attendance policy and HEP activities. All internal or external pelvic floor assessments and/or treatments are completed with proper hand hygiene and gloves hands. If needed gloves are changed with hand hygiene during patient care time.  PELVIC MMT: MMT eval  Vaginal 3/5, 10 quick flicks, 3 sec hold  Internal Anal Sphincter 3/5  External Anal Sphincter 3/5  Puborectalis 3/5  (Blank rows = not tested)       TONE: High tone in deep pelvic floor musculature bilaterally; low tone superficially   PROLAPSE: Posterior vaginal wall laxity present in  hooklying with cough test   TODAY'S TREATMENT:                                                                                                                              DATE:   EVAL 12/30/24: Examination completed, findings reviewed, pt educated on POC, HEP, and self care. Pt motivated to participate in PT and agreeable to attempt recommendations.   Hooklying pelvic floor contraction + diaphragmatic breathing 2x10  Hooklying quick flick contractions + diaphragmatic breathing 2x10  - Get To Know Your Pelvic Floor- Female - Urinary Incontinence - High-Fiber Diet to Support Pelvic Health - Lifestyle Changes  that Support Urinary Health  01/04/25: Seated pelvic floor contraction + diaphragmatic breathing x38min  Seated quick flick contractions + diaphragmatic breathing x15 Seated adductor ball squeeze + transverse abdominis contraction + diaphragmatic breathing 2x10  Sidelying clamshell + diaphragmatic breathing 2x10  Sidelying reverse clamshell + diaphragmatic breathing 2x10  Bridge + adductor ball squeeze + diaphragmatic breathing 2x10  Toileting mechanics for optimal emptying of bladder and bowels  Double voiding technique  Blow as you go  Squatty potty discussion   01/12/25: Seated pelvic floor contraction + diaphragmatic breathing x54min  Standing pelvic floor contraction + diaphragmatic breathing x55min  Seated quick flick pelvic floor contractions x20  1 minute: 5 adductor ball squeeze, 5 gluteal squeezes, 5 kegels - focusing on differentiation of the muscle groups  Seated adductor ball squeeze + internal rotation + diaphragmatic breathing 2x10 each side  Seated hip abduction + GTB + diaphragmatic breathing 2x12  Sit to stand + diaphragmatic breathing 2x10   01/19/25: Discussion and review of toileting mechanics for fecal smearing control: Relaxing while on the toilet, using squatty potty  Balloon breathing and splinting technique  Sitting for 3-10 minutes max  Internal treatment: Patient confirms identification and approves physical therapist to perform internal soft tissue work  Internal rectal assessment: Weakness present in rectal musculature -- 2/5 pelvic floor contraction strength present  Pt able to bulge and contract her pelvic floor accordingly, but there is some anterior rectal wall laxity present with cough test Pt trained on balloon breathing for pelvic floor relaxation while defecating  Pt trained on pelvic floor coordination contractions while in sidelying combined with diaphragmatic breathing   PATIENT EDUCATION:  Education details: - Get To Know Your Pelvic Floor-  Female - Urinary Incontinence - High-Fiber Diet to Support Pelvic Health - Lifestyle Changes that Support Urinary Health Person educated: Patient Education method: Explanation, Demonstration, Tactile cues, Verbal cues, and Handouts Education comprehension: verbalized understanding, returned demonstration, verbal cues required, tactile cues required, and needs further education  HOME EXERCISE PROGRAM: Access Code: [email or web address] URL: [email or web address] Date: 01/04/2025 Prepared by: Celena Domino  Exercises - Supine Pelvic Floor Contraction  - 1 x daily - 7 x weekly - 1 sets - 10 reps - Seated Pelvic Floor Contraction  - 1 x daily - 7 x weekly - 2 sets - 10 reps -  Seated Quick Flick Pelvic Floor Contractions  - 1 x daily - 7 x weekly - 2 sets - 15 reps - Seated Hip Adduction Isometrics with Ball  - 1 x daily - 7 x weekly - 2 sets - 10 reps - Supine Bridge with Mini Swiss Ball Between Knees  - 1 x daily - 7 x weekly - 2 sets - 10 reps - Clamshell  - 1 x daily - 7 x weekly - 2 sets - 10 reps - Sidelying Reverse Clamshell  - 1 x daily - 7 x weekly - 2 sets - 10 reps  ASSESSMENT:  CLINICAL IMPRESSION: Patient is a 59 y.o. female  who was seen today for physical therapy treatment for urinary leakage and vaginal wall laxity. No incontinence since last visit and bowel movements have been regular, however, she has noticed fecal smearing after bowel movements due to incomplete rectal emptying. She has been practicing pelvic floor relaxation while on the toilet, but still feels the need to splint after bowel movements. Rectal examination performed today with patient consent and pt found to have weakness present in rectal musculature -- 3/5 pelvic floor contraction strength present; Pt able to bulge and contract her pelvic floor accordingly, but there is some anterior rectal wall laxity present with cough test; Pt trained on balloon breathing for pelvic floor relaxation while defecating; Pt  trained on pelvic floor coordination contractions while in sidelying combined with diaphragmatic breathing.  No pain at end of session. Overall, pt tolerated session well and Pt would benefit from additional PT to further address urinary leakage, pelvic floor weakness, and vaginal wall laxity.    OBJECTIVE IMPAIRMENTS: decreased coordination, decreased endurance, decreased mobility, decreased ROM, decreased strength, impaired flexibility, and pain.   ACTIVITY LIMITATIONS: standing, continence, toileting, and being on feet for long periods of time   PARTICIPATION LIMITATIONS: interpersonal relationship, community activity, and occupation  PERSONAL FACTORS: Age, Past/current experiences, and Time since onset of injury/illness/exacerbation are also affecting patient's functional outcome.   REHAB POTENTIAL: Good  CLINICAL DECISION MAKING: Stable/uncomplicated  EVALUATION COMPLEXITY: Low   GOALS: Goals reviewed with patient? Yes  SHORT TERM GOALS: Target date: 01/27/2025  Pt will be independent with HEP.  Baseline: Goal status: INITIAL  2.  Pt will be independent with diaphragmatic breathing and down training activities in order to improve pelvic floor relaxation. Baseline:  Goal status: INITIAL  3.  Pt will be independent with the knack, urge suppression technique, and double voiding in order to improve bladder habits and decrease urinary incontinence.  Baseline:  Goal status: INITIAL  4.  Pt will be independent with use of squatty potty, relaxed toileting mechanics, and improved bowel movement techniques in order to increase ease of bowel movements and complete evacuation.  Baseline:  Goal status: INITIAL  LONG TERM GOALS: Target date: 06/29/2025  Pt will be independent with advanced HEP.  Baseline:  Goal status: INITIAL  2.  Pt to demonstrate improved coordination of pelvic floor and breathing mechanics with 10# squat with appropriate synergistic patterns to decrease pain and  leakage at least 75% of the time for improved ability to complete a 30 minute workout with strain at pelvic floor and symptoms.   Baseline:  Goal status: INITIAL  3.  Pt will be able to correctly perform diaphragmatic breathing and appropriate pressure management in order to prevent worsening vaginal wall laxity and improve pelvic floor A/ROM.  Baseline:  Goal status: INITIAL  4.  Pt will demonstrate normal pelvic floor muscle  tone and A/ROM, able to achieve 4/5 strength with contractions and 10 sec endurance, in order to reduce urinary leaking and number of pads patient wears.   Baseline:  Goal status: INITIAL  PLAN:  PT FREQUENCY: 1-2x/week  PT DURATION: 6 months  PLANNED INTERVENTIONS: 97110-Therapeutic exercises, 97530- Therapeutic activity, 97112- Neuromuscular re-education, 97535- Self Care, [id number]- Manual therapy, Patient/Family education, Taping, Joint mobilization, Spinal mobilization, Scar mobilization, Cryotherapy, Moist heat, and Biofeedback  PLAN FOR NEXT SESSION: continued pelvic floor trianing in seated position; introduce toileting mechanics; discuss pressure management for prolapse; decreasing fluid intake 2 hours before bedtime; core and hip strengthening  Celena JAYSON Domino, PT 01/19/2025, 12:04 PM [facility] [address] [place], [place] [address] Phone # [phone] Fax [phone]  "

## 2025-01-19 NOTE — Patient Instructions (Signed)
 Toileting mechanics update: Make sure to try to relax your ful body when on toilet Sit down all the way, feet flat  When feeling the need to strain, before you manually assist yourself: Take 3 balloon breaths: inflate and then deflate your belly 3 times  Then you can use your finger to manually splint  Make sure you only sit for 3-10 minutes at a time on the toilet

## 2025-01-20 ENCOUNTER — Other Ambulatory Visit: Payer: PRIVATE HEALTH INSURANCE

## 2025-01-20 DIAGNOSIS — Z006 Encounter for examination for normal comparison and control in clinical research program: Secondary | ICD-10-CM

## 2025-01-20 LAB — GENECONNECT MOLECULAR SCREEN

## 2025-01-21 ENCOUNTER — Ambulatory Visit: Payer: Self-pay | Admitting: Family Medicine

## 2025-01-26 ENCOUNTER — Ambulatory Visit: Payer: Self-pay | Admitting: Physical Therapy

## 2025-01-26 DIAGNOSIS — R293 Abnormal posture: Secondary | ICD-10-CM

## 2025-01-26 DIAGNOSIS — M6281 Muscle weakness (generalized): Secondary | ICD-10-CM

## 2025-01-26 DIAGNOSIS — R102 Pelvic and perineal pain unspecified side: Secondary | ICD-10-CM

## 2025-01-26 DIAGNOSIS — R279 Unspecified lack of coordination: Secondary | ICD-10-CM

## 2025-01-26 NOTE — Therapy (Signed)
 " OUTPATIENT PHYSICAL THERAPY FEMALE PELVIC TREATMENT   Patient Name: Laura Rice MRN: [id number] DOB:1966/07/16, 59 y.o., female Today's Date: 01/26/2025  END OF SESSION:  PT End of Session - 01/26/25 1623     Visit Number 5    Number of Visits 8    Date for Recertification  02/24/25    Authorization Type Generic Commercial    Authorization Time Period no auth req    PT Start Time 0415    PT Stop Time 0440    PT Time Calculation (min) 25 min    Activity Tolerance Patient tolerated treatment well;No increased pain    Behavior During Therapy [facility] for tasks assessed/performed             Past Medical History:  Diagnosis Date   Allergy    Asthma    Colon polyps    colonoscopy 05/23/16  in [place] fever    Hyperlipidemia    Pt follows w/ PCP Dr. Clotilda Single, MD, [place] 07/17/22.   Migraines    just treats with Tylenol  OTC,  maybe one or two month   Peptic ulcer    diagnosed 1989, last issue with peptic ulcer around 2001 per pt on 8/18//23   PONV (postoperative nausea and vomiting)    hx of severe nausea & vomiting after surgery   Prediabetes    07/17/22 Hgb A1C 6.1   Uterine fibroid 2023   Wears glasses    Past Surgical History:  Procedure Laterality Date   ABDOMINAL HYSTERECTOMY     CYSTOSCOPY N/A 08/29/2022   Procedure: CYSTOSCOPY;  Surgeon: Danielle Rom, MD;  Location: [facility];  Service: Gynecology;  Laterality: N/A;   LAPAROSCOPIC LYSIS OF ADHESIONS  08/29/2022   Procedure: LAPAROSCOPIC LYSIS OF ADHESIONS;  Surgeon: Danielle Rom, MD;  Location: [facility];  Service: Gynecology;;   LAPAROSCOPIC VAGINAL HYSTERECTOMY WITH SALPINGECTOMY Bilateral 08/29/2022   Procedure: LAPAROSCOPIC ASSISTED VAGINAL HYSTERECTOMY WITH SALPINGECTOMY;  Surgeon: Danielle Rom, MD;  Location: [facility];  Service: Gynecology;  Laterality: Bilateral;   NOVASURE ABLATION  2008   THYROIDECTOMY, PARTIAL   2010   TUBAL LIGATION     Patient Active Problem List   Diagnosis Date Noted   Menopausal hyperhidrosis 01/15/2024   Snoring 01/15/2024   Witnessed episode of apnea 01/15/2024   S/P laparoscopic assisted vaginal hysterectomy (LAVH) 08/29/2022   Fibroids 02/22/2022   Hyperlipidemia 02/03/2019   Seasonal allergies 02/03/2019   History of migraine 02/03/2019   History of asthma 02/03/2019   Menopause 03/10/2013   PCP: Single Clotilda SAUNDERS, MD  REFERRING PROVIDER: Danielle Rom, MD   REFERRING DIAG: N39.3 (ICD-10-CM) - Stress incontinence (female) (female)  THERAPY DIAG:  Abnormal posture  Muscle weakness (generalized)  Pelvic pain  Unspecified lack of coordination  Rationale for Evaluation and Treatment: Rehabilitation  ONSET DATE: 4 years   SUBJECTIVE:  SUBJECTIVE STATEMENT: No pain after last session. She is having more complete bowel movements - she has started fiber and she is having success with it. No urinary symptoms to report. No other bowel concerns to report. No consitpation issues with the new fiber (Metamucil). No heaviness or pai nto report.   From eval: Patient reports to PFPT with urinary leakage and some vaginal wall laxity/pressure that has been causing her to need to splint in the perineal body when passing bowel movements. Her OBGYN recommended pelvic PT. This pressure feels like heaviness and it is intermittent in nature.  Fluid intake: moderate water intake depending on the day; coffee 20 oz in the morning; sometimes a hot tea   FUNCTIONAL LIMITATIONS: feeling like she needs to have a bathroom nearby   PERTINENT HISTORY:  Medications for current condition: none  Surgeries: hysterectomy 2023; tubal ligation '93 Other: N/A Sexual abuse: No  PAIN:  Are you having  pain? No - just pressure  NPRS scale: 0/10  PRECAUTIONS: None  RED FLAGS: None   WEIGHT BEARING RESTRICTIONS: No  FALLS:  Has patient fallen in last 6 months? 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   ACTIVITY LEVEL : low; wishes to increase her activity level   PLOF: Independent  PATIENT GOALS: strengthen the vaginal canal so the pressure is less; improve urinary control  BOWEL MOVEMENT: Pain with bowel movement: No Type of bowel movement:Type (Bristol Stool Scale) 2,3,4, Frequency 1x/day, Strain depends on what time of day it is, and Splinting yes in the perineal space  Fully empty rectum: No Leakage: No                                                  Caused by: no Bowel urgency: no Pads: No Fiber supplement/laxative No  URINATION: Pain with urination: No Fully empty bladder: Yes: but could go back shortly                                          Post-void dribble: No Stream: Strong Urgency: Yes  Frequency:during the day within normal limits                                                         Nocturia: Yes: at least one time per night   Leakage: Urge to void and Walking to the bathroom Pads/briefs: No  INTERCOURSE:  Ability to have vaginal penetration Yes  Pain with intercourse: During Penetration Dryness: No Climax: yes  Marinoff Scale: 2/3 Lubricant: no   PREGNANCY: Vaginal deliveries 2 Tearing Yes: with the first delivery  Episiotomy No C-section deliveries 0 Currently pregnant No  PROLAPSE: Pressure: notices this more when she is on her feet   OBJECTIVE:  Note: Objective measures were completed at Evaluation unless otherwise noted.  PATIENT SURVEYS:  PFIQ-7: 45  COGNITION: Overall cognitive status: Within functional limits for tasks assessed     SENSATION: Light touch: Appears intact  LUMBAR SPECIAL TESTS:  Straight leg raise test: Positive  FUNCTIONAL TESTS:  Single leg  stance:  Rt: +   Lt: +  Sit-up test: 1/3 Squat: within normal limits  Bed mobility: within functional limits   GAIT: Assistive device utilized: None Comments: mild trendelenburg gait pattern with ambulation   POSTURE: rounded shoulders  LUMBARAROM/PROM: within normal limits bilaterally with no pain   LOWER EXTREMITY ROM: within normal limits bilaterally with no pain   LOWER EXTREMITY MMT:  MMT Right eval Left eval  Hip flexion 4 5  Hip extension    Hip abduction 5 5  Hip adduction 5 5  Hip internal rotation    Hip external rotation    Knee flexion 4 4  Knee extension 5 5  Ankle dorsiflexion    Ankle plantarflexion    Ankle inversion    Ankle eversion     (Blank rows = not tested) PALPATION:  General: no tenderness to palpation of bilateral adductors or pubic bone   Pelvic Alignment: within normal limits; slight anterior pelvic tilt at rest   Abdominal: abdominal bracing at rest  Diastasis: No Distortion: No  Breathing: apical breathing pattern with decreased lower rib excursion during inhalation  Scar tissue: Yes:   Active Straight Leg Raise: +                 External Perineal Exam: scar tissue present in perineal space that is rigid and adhered but not painful; mild dryness present with sufficient clitoral hood mobility                              Internal Pelvic Floor: Patient demonstrates deep pelvic floor muscle tension in bilateral obturators that is not painful with palpation. She has a moderate strength pelvic floor contraction that is not coordinated with her diaphragm. She demonstrates posterior vaginal wall laxity with cough test in hooklying.   Patient confirms identification and approves PT to assess internal pelvic floor and treatment Yes No emotional/communication barriers or cognitive limitation. Patient is motivated to learn. Patient understands and agrees with treatment goals and plan. PT explains patient will be examined in standing, sitting,  and lying down to see how their muscles and joints work. When they are ready, they will be asked to remove their underwear so PT can examine their perineum. The patient is also given the option of providing their own chaperone as one is not provided in our facility. The patient also has the right and is explained the right to defer or refuse any part of the evaluation or treatment including the internal exam. With the patient's consent, PT will use one gloved finger to gently assess the muscles of the pelvic floor, seeing how well it contracts and relaxes and if there is muscle symmetry. After, the patient will get dressed and PT and patient will discuss exam findings and plan of care. PT and patient discuss plan of care, schedule, attendance policy and HEP activities. All internal or external pelvic floor assessments and/or treatments are completed with proper hand hygiene and gloves hands. If needed gloves are changed with hand hygiene during patient care time.  PELVIC MMT: MMT eval  Vaginal 3/5, 10 quick flicks, 3 sec hold  Internal Anal Sphincter 3/5  External Anal Sphincter 3/5  Puborectalis 3/5  (Blank rows = not tested)       TONE: High tone in deep pelvic floor musculature bilaterally; low tone superficially   PROLAPSE: Posterior vaginal wall laxity present in hooklying with  cough test   TODAY'S TREATMENT:                                                                                                                              DATE:   EVAL 12/30/24: Examination completed, findings reviewed, pt educated on POC, HEP, and self care. Pt motivated to participate in PT and agreeable to attempt recommendations.   Hooklying pelvic floor contraction + diaphragmatic breathing 2x10  Hooklying quick flick contractions + diaphragmatic breathing 2x10  - Get To Know Your Pelvic Floor- Female - Urinary Incontinence - High-Fiber Diet to Support Pelvic Health - Lifestyle Changes that Support Urinary  Health  01/04/25: Seated pelvic floor contraction + diaphragmatic breathing x4min  Seated quick flick contractions + diaphragmatic breathing x15 Seated adductor ball squeeze + transverse abdominis contraction + diaphragmatic breathing 2x10  Sidelying clamshell + diaphragmatic breathing 2x10  Sidelying reverse clamshell + diaphragmatic breathing 2x10  Bridge + adductor ball squeeze + diaphragmatic breathing 2x10  Toileting mechanics for optimal emptying of bladder and bowels  Double voiding technique  Blow as you go  Squatty potty discussion   01/12/25: Seated pelvic floor contraction + diaphragmatic breathing x6min  Standing pelvic floor contraction + diaphragmatic breathing x52min  Seated quick flick pelvic floor contractions x20  1 minute: 5 adductor ball squeeze, 5 gluteal squeezes, 5 kegels - focusing on differentiation of the muscle groups  Seated adductor ball squeeze + internal rotation + diaphragmatic breathing 2x10 each side  Seated hip abduction + GTB + diaphragmatic breathing 2x12  Sit to stand + diaphragmatic breathing 2x10   01/19/25: Discussion and review of toileting mechanics for fecal smearing control: Relaxing while on the toilet, using squatty potty  Balloon breathing and splinting technique  Sitting for 3-10 minutes max  Internal treatment: Patient confirms identification and approves physical therapist to perform internal soft tissue work  Internal rectal assessment: Weakness present in rectal musculature -- 2/5 pelvic floor contraction strength present  Pt able to bulge and contract her pelvic floor accordingly, but there is some anterior rectal wall laxity present with cough test Pt trained on balloon breathing for pelvic floor relaxation while defecating  Pt trained on pelvic floor coordination contractions while in sidelying combined with diaphragmatic breathing   01/26/25:  Seated pelvic floor contraction + diaphragmatic breathing x51min  Standing pelvic floor  contraction + diaphragmatic breathing x15min  - no urgency increase  Seated  quick flick contractions + diaphragmatic breathing x20 -- 17 seconds with no difficulty  Standing quick flick contractions + diaphragmatic breathing x20 --  Review of HEP with patient in clinic   PATIENT EDUCATION:  Education details: - Get To Know Your Pelvic Floor- Female - Urinary Incontinence - High-Fiber Diet to Support Pelvic Health - Lifestyle Changes that Support Urinary Health Person educated: Patient Education method: Explanation, Demonstration, Tactile cues, Verbal cues, and Handouts Education comprehension: verbalized understanding, returned demonstration, verbal cues required, tactile cues required, and needs further education  HOME EXERCISE PROGRAM: Access Code: [email or web address] URL: [email or web address] Date: 01/26/2025 Prepared by: Celena Domino  Exercises - Seated Pelvic Floor Contraction  - 1 x daily - 7 x weekly - 2 sets - 15 reps - Standing Pelvic Floor Contraction  - 1 x daily - 7 x weekly - 2 sets - 15 reps - Seated Quick Flick Pelvic Floor Contractions  - 1 x daily - 7 x weekly - 2 sets - 20 reps - Seated Hip Adduction Isometrics with Ball  - 1 x daily - 7 x weekly - 2 sets - 10 reps - Supine Bridge with Mini Swiss Ball Between Knees  - 1 x daily - 7 x weekly - 2 sets - 10 reps - Seated Hip Internal Rotation with Ball and Resistance  - 1 x daily - 7 x weekly - 2 sets - 10 reps - Seated Hip Abduction with Resistance  - 1 x daily - 7 x weekly - 2 sets - 10 reps - Sit to Stand Without Arm Support  - 1 x daily - 7 x weekly - 2 sets - 10 reps  ASSESSMENT:  CLINICAL IMPRESSION: Patient is a 59 y.o. female  who was seen today for physical therapy treatment for urinary leakage and vaginal wall laxity. No incontinence since last visit and bowel movements have been more complete. She is consistent with HEP and wishes to review pelvic floor training and other HEP exercises today. Pt provided  with additional resistance band (blue) to increase resistance at home with HEP independently. Overall, pt tolerated session well and Pt would benefit from additional PT to further address urinary leakage, pelvic floor weakness, and vaginal wall laxity.    OBJECTIVE IMPAIRMENTS: decreased coordination, decreased endurance, decreased mobility, decreased ROM, decreased strength, impaired flexibility, and pain.   ACTIVITY LIMITATIONS: standing, continence, toileting, and being on feet for long periods of time   PARTICIPATION LIMITATIONS: interpersonal relationship, community activity, and occupation  PERSONAL FACTORS: Age, Past/current experiences, and Time since onset of injury/illness/exacerbation are also affecting patient's functional outcome.   REHAB POTENTIAL: Good  CLINICAL DECISION MAKING: Stable/uncomplicated  EVALUATION COMPLEXITY: Low   GOALS: Goals reviewed with patient? Yes  SHORT TERM GOALS: Target date: 01/27/2025  Pt will be independent with HEP.  Baseline: Goal status: INITIAL  2.  Pt will be independent with diaphragmatic breathing and down training activities in order to improve pelvic floor relaxation. Baseline:  Goal status: INITIAL  3.  Pt will be independent with the knack, urge suppression technique, and double voiding in order to improve bladder habits and decrease urinary incontinence.  Baseline:  Goal status: INITIAL  4.  Pt will be independent with use of squatty potty, relaxed toileting mechanics, and improved bowel movement techniques in order to increase ease of bowel movements and complete evacuation.  Baseline:  Goal status: INITIAL  LONG TERM GOALS: Target date: 06/29/2025  Pt will be independent with advanced HEP.  Baseline:  Goal status: INITIAL  2.  Pt to demonstrate improved coordination of pelvic floor and breathing mechanics with 10# squat with appropriate synergistic patterns to decrease pain and leakage at least 75% of the time for  improved ability to complete a 30 minute workout with strain at pelvic floor and symptoms.   Baseline:  Goal status: INITIAL  3.  Pt will be able to correctly perform diaphragmatic breathing and appropriate pressure management in order to prevent worsening vaginal wall laxity and improve pelvic floor A/ROM.  Baseline:  Goal status: INITIAL  4.  Pt will demonstrate normal pelvic floor muscle tone and A/ROM, able to achieve 4/5 strength with contractions and 10 sec endurance, in order to reduce urinary leaking and number of pads patient wears.   Baseline:  Goal status: INITIAL  PLAN:  PT FREQUENCY: 1-2x/week  PT DURATION: 6 months  PLANNED INTERVENTIONS: 97110-Therapeutic exercises, 97530- Therapeutic activity, 97112- Neuromuscular re-education, 97535- Self Care, [id number]- Manual therapy, Patient/Family education, Taping, Joint mobilization, Spinal mobilization, Scar mobilization, Cryotherapy, Moist heat, and Biofeedback  PLAN FOR NEXT SESSION: continued pelvic floor trianing in seated position; introduce toileting mechanics; discuss pressure management for prolapse; decreasing fluid intake 2 hours before bedtime; core and hip strengthening  Celena JAYSON Domino, PT 01/26/2025, 4:37 PM [facility] [address] [place], [place] [address] Phone # [phone] Fax [phone]  "

## 2025-02-02 ENCOUNTER — Ambulatory Visit: Payer: PRIVATE HEALTH INSURANCE | Admitting: Physical Therapy

## 2025-02-09 ENCOUNTER — Ambulatory Visit: Payer: PRIVATE HEALTH INSURANCE | Admitting: Physical Therapy

## 2025-02-16 ENCOUNTER — Ambulatory Visit: Payer: PRIVATE HEALTH INSURANCE | Admitting: Physical Therapy

## 2025-02-23 ENCOUNTER — Ambulatory Visit: Payer: PRIVATE HEALTH INSURANCE | Admitting: Physical Therapy

## 2025-03-02 ENCOUNTER — Ambulatory Visit: Payer: PRIVATE HEALTH INSURANCE | Admitting: Physical Therapy
